# Patient Record
Sex: Male | Born: 1995 | Race: Black or African American | Hispanic: No | Marital: Single | Smoking: Current some day smoker
Health system: Southern US, Community
[De-identification: ages and names within clinical notes are randomized; demographics above are authoritative.]

## PROBLEM LIST (undated history)

## (undated) DIAGNOSIS — G43909 Migraine, unspecified, not intractable, without status migrainosus: Secondary | ICD-10-CM

## (undated) DIAGNOSIS — R519 Headache, unspecified: Secondary | ICD-10-CM

## (undated) DIAGNOSIS — R51 Headache: Secondary | ICD-10-CM

## (undated) DIAGNOSIS — J45909 Unspecified asthma, uncomplicated: Secondary | ICD-10-CM

## (undated) DIAGNOSIS — K589 Irritable bowel syndrome without diarrhea: Secondary | ICD-10-CM

## (undated) HISTORY — DX: Headache, unspecified: R51.9

## (undated) HISTORY — PX: ESOPHAGOGASTRODUODENOSCOPY ENDOSCOPY: SHX5814

## (undated) HISTORY — DX: Headache: R51

## (undated) HISTORY — DX: Migraine, unspecified, not intractable, without status migrainosus: G43.909

---
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
## 2014-10-15 ENCOUNTER — Emergency Department (HOSPITAL_COMMUNITY): Payer: BLUE CROSS/BLUE SHIELD

## 2014-10-15 ENCOUNTER — Encounter (HOSPITAL_COMMUNITY): Payer: Self-pay | Admitting: *Deleted

## 2014-10-15 ENCOUNTER — Emergency Department (HOSPITAL_COMMUNITY)
Admission: EM | Admit: 2014-10-15 | Discharge: 2014-10-15 | Disposition: A | Discharge status: Home / Self Care | Payer: BLUE CROSS/BLUE SHIELD | Attending: Emergency Medicine | Admitting: Emergency Medicine

## 2014-10-15 DIAGNOSIS — R1030 Lower abdominal pain, unspecified: Secondary | ICD-10-CM | POA: Insufficient documentation

## 2014-10-15 DIAGNOSIS — R112 Nausea with vomiting, unspecified: Secondary | ICD-10-CM | POA: Insufficient documentation

## 2014-10-15 DIAGNOSIS — R197 Diarrhea, unspecified: Secondary | ICD-10-CM | POA: Diagnosis not present

## 2014-10-15 DIAGNOSIS — R630 Anorexia: Secondary | ICD-10-CM | POA: Diagnosis not present

## 2014-10-15 DIAGNOSIS — R109 Unspecified abdominal pain: Secondary | ICD-10-CM | POA: Diagnosis present

## 2014-10-15 HISTORY — DX: Unspecified asthma, uncomplicated: J45.909

## 2014-10-15 LAB — COMPREHENSIVE METABOLIC PANEL
ALT: 24 U/L (ref 0–53)
AST: 20 U/L (ref 0–37)
Albumin: 4.8 g/dL (ref 3.5–5.2)
Alkaline Phosphatase: 48 U/L (ref 39–117)
Anion gap: 6 (ref 5–15)
BUN: 10 mg/dL (ref 6–23)
CO2: 30 mmol/L (ref 19–32)
Calcium: 9.7 mg/dL (ref 8.4–10.5)
Chloride: 106 mmol/L (ref 96–112)
Creatinine, Ser: 0.86 mg/dL (ref 0.50–1.35)
GFR calc Af Amer: >90 mL/min (ref >90)
GLUCOSE: 95 mg/dL (ref 70–99)
POTASSIUM: 3.5 mmol/L (ref 3.5–5.1)
Sodium: 142 mmol/L (ref 135–145)
Total Bilirubin: 1.1 mg/dL (ref 0.3–1.2)
Total Protein: 7.4 g/dL (ref 6.0–8.3)

## 2014-10-15 LAB — CBC WITH DIFFERENTIAL/PLATELET
BASOS ABS: 0 10*3/uL (ref 0.0–0.1)
BASOS PCT: 0 % (ref 0–1)
Eosinophils Absolute: 0 10*3/uL (ref 0.0–0.7)
Eosinophils Relative: 0 % (ref 0–5)
HCT: 38.3 % — ABNORMAL LOW (ref 39.0–52.0)
Hemoglobin: 13.6 g/dL (ref 13.0–17.0)
Lymphocytes Relative: 18 % (ref 12–46)
Lymphs Abs: 1.3 10*3/uL (ref 0.7–4.0)
MCH: 30.5 pg (ref 26.0–34.0)
MCHC: 35.5 g/dL (ref 30.0–36.0)
MCV: 85.9 fL (ref 78.0–100.0)
MONOS PCT: 6 % (ref 3–12)
Monocytes Absolute: 0.5 10*3/uL (ref 0.1–1.0)
Neutro Abs: 5.4 10*3/uL (ref 1.7–7.7)
Neutrophils Relative %: 76 % (ref 43–77)
Platelets: 194 10*3/uL (ref 150–400)
RBC: 4.46 MIL/uL (ref 4.22–5.81)
RDW: 12.9 % (ref 11.5–15.5)
WBC: 7.2 10*3/uL (ref 4.0–10.5)

## 2014-10-15 LAB — URINALYSIS, ROUTINE W REFLEX MICROSCOPIC
Bilirubin Urine: NEGATIVE
Glucose, UA: NEGATIVE mg/dL
Hgb urine dipstick: NEGATIVE
Ketones, ur: NEGATIVE mg/dL
Leukocytes, UA: NEGATIVE
Nitrite: NEGATIVE
PH: 6.5 (ref 5.0–8.0)
Protein, ur: NEGATIVE mg/dL
SPECIFIC GRAVITY, URINE: 1.021 (ref 1.005–1.030)
UROBILINOGEN UA: 1 mg/dL (ref 0.0–1.0)

## 2014-10-15 LAB — LIPASE, BLOOD: Lipase: 25 U/L (ref 11–59)

## 2014-10-15 MED ORDER — ONDANSETRON 8 MG PO TBDP: 8.0000 mg | ORAL_TABLET | Freq: Three times a day (TID) | ORAL | Status: DC | PRN

## 2014-10-15 MED ORDER — SODIUM CHLORIDE 0.9 % IV BOLUS (SEPSIS)
500.0000 mL | Freq: Once | INTRAVENOUS | Status: AC
  Administered 2014-10-15: 500 mL via INTRAVENOUS

## 2014-10-15 MED ORDER — OMEPRAZOLE 20 MG PO CPDR: 20.0000 mg | DELAYED_RELEASE_CAPSULE | Freq: Every day | ORAL | Status: DC

## 2014-10-15 MED ORDER — IOHEXOL 300 MG/ML  SOLN
50.0000 mL | Freq: Once | INTRAMUSCULAR | Status: AC | PRN
  Administered 2014-10-15: 50 mL via ORAL

## 2014-10-15 MED ORDER — ONDANSETRON HCL 4 MG/2ML IJ SOLN
4.0000 mg | Freq: Once | INTRAMUSCULAR | Status: AC
  Administered 2014-10-15: 4 mg via INTRAVENOUS
  Filled 2014-10-15: qty 2

## 2014-10-15 MED ORDER — IOHEXOL 300 MG/ML  SOLN
100.0000 mL | Freq: Once | INTRAMUSCULAR | Status: AC | PRN
  Administered 2014-10-15: 100 mL via INTRAVENOUS

## 2014-10-15 NOTE — ED Notes (Signed)
Per [facility], pt seen by Dr. Cristobal GoldmannBorun and MD suggested CT abd/pelvis with contrast as symptoms have continued for a month without improvement.

## 2014-10-15 NOTE — Discharge Instructions (Signed)

## 2014-10-15 NOTE — Progress Notes (Signed)
pcp is  Silas FloodBORUN, ALEXANDER GREGORY    [address] [place], [place] [id number] [phone] [facility]

## 2014-10-15 NOTE — ED Provider Notes (Signed)
CSN: [id number]     Arrival date & time 10/15/14  4098 History   First MD Initiated Contact with Patient 10/15/14 1037     Chief Complaint  Patient presents with  . Emesis  . Nausea  . Abdominal Pain     (Consider location/radiation/quality/duration/timing/severity/associated sxs/prior Treatment) Patient is a 18 y.o. male presenting with vomiting and abdominal pain. The history is provided by the patient.  Emesis Associated symptoms: abdominal pain and diarrhea   Abdominal Pain Associated symptoms: diarrhea, nausea and vomiting   Associated symptoms: no chest pain, no fever and no shortness of breath    patient with nausea vomiting for the last month. Also abdominal pain. States the pain is better for over the last month but he gets episodes of increased pain when he is hungry. He states he has had some vomiting. He states eating does not change the pain. No fevers. He has had a little diarrhea last couple days. Saw his primary care doctor 3 days ago who stated they were going to get a CT scan, reportedly his insurance will not cover it. Told to return to the ER if pain continued. No fevers. No dysuria. No fevers. No weight loss.  Past Medical History  Diagnosis Date  . Asthma    History reviewed. No pertinent past surgical history. No family history on file. History  Substance Use Topics  . Smoking status: Never Smoker   . Smokeless tobacco: Not on file  . Alcohol Use: No    Review of Systems  Constitutional: Positive for appetite change. Negative for fever, activity change and unexpected weight change.  Eyes: Negative for pain.  Respiratory: Negative for chest tightness and shortness of breath.   Cardiovascular: Negative for chest pain and leg swelling.  Gastrointestinal: Positive for nausea, vomiting, abdominal pain and diarrhea.  Genitourinary: Negative for flank pain.  Musculoskeletal: Negative for back pain and neck stiffness.  Skin: Negative for rash.  Neurological:  Negative for weakness and numbness.  Psychiatric/Behavioral: Negative for behavioral problems.      Allergies  Other  Home Medications   Prior to Admission medications   Medication Sig Start Date End Date Taking? Authorizing Provider  omeprazole (PRILOSEC) 20 MG capsule Take 1 capsule (20 mg total) by mouth daily. 10/15/14   Juliet Rude. Rubin Payor, MD  ondansetron (ZOFRAN-ODT) 8 MG disintegrating tablet Take 1 tablet (8 mg total) by mouth every 8 (eight) hours as needed for nausea or vomiting. 10/15/14   Juliet Rude. Mccartney Chuba, MD   BP 152/85 mmHg  Pulse 72  Temp(Src) 98.1 F (36.7 C) (Oral)  Resp 16  SpO2 100% Physical Exam  Constitutional: He is oriented to person, place, and time. He appears well-developed and well-nourished.  HENT:  Head: Normocephalic and atraumatic.  Neck: Normal range of motion. Neck supple.  Cardiovascular: Normal rate, regular rhythm and normal heart sounds.   No murmur heard. Pulmonary/Chest: Effort normal and breath sounds normal.  Abdominal: Soft. He exhibits no mass. There is tenderness. There is no rebound and no guarding.  Mid to lower abdominal tenderness. No rebound or guarding.  Genitourinary:  No CVA tenderness.  Musculoskeletal: Normal range of motion. He exhibits no edema.  Neurological: He is alert and oriented to person, place, and time. No cranial nerve deficit.  Skin: Skin is warm and dry.  Psychiatric: He has a normal mood and affect.  Nursing note and vitals reviewed.   ED Course  Procedures (including critical care time) Labs Review Labs Reviewed  CBC  WITH DIFFERENTIAL/PLATELET - Abnormal; Notable for the following:    HCT 38.3 (*)    All other components within normal limits  URINALYSIS, ROUTINE W REFLEX MICROSCOPIC  COMPREHENSIVE METABOLIC PANEL  LIPASE, BLOOD    Imaging Review Ct Abdomen Pelvis W Contrast  10/15/2014   CLINICAL DATA:  Acute mid abdominal pain, nausea and vomiting  EXAM: CT ABDOMEN AND PELVIS WITH CONTRAST   TECHNIQUE: Multidetector CT imaging of the abdomen and pelvis was performed using the standard protocol following bolus administration of intravenous contrast.  CONTRAST:  100mL OMNIPAQUE IOHEXOL 300 MG/ML  SOLN  COMPARISON:  None.  FINDINGS: Lower chest: Clear lung bases. Normal heart size. No pericardial or pleural effusion.  Abdomen: Liver, gallbladder, biliary system, pancreas, spleen, adrenal glands, and kidneys are within normal limits for age and demonstrate no acute process.  No renal obstruction or hydronephrosis. No obstructing urinary tract calculus.  Negative for bowel obstruction, dilatation, ileus, free air.  No abdominal free fluid, fluid collection, hemorrhage, abscess, or adenopathy.  Normal appendix opacified with oral contrast in the right lower quadrant extending into the pelvis.  Normal aorta.  Negative for aneurysm.  Pelvis: Urinary bladder unremarkable. Distal colon is air and stool filled. No pelvic free fluid, fluid collection, hemorrhage, abscess, adenopathy, inguinal abnormality, or hernia.  No acute osseous finding.  IMPRESSION: No acute intra-abdominal or pelvic finding.  Normal appendix.   Electronically Signed   By: Ruel Favorsrevor  Shick M.D.   On: 10/15/2014 14:12     EKG Interpretation None      MDM   Final diagnoses:  Abdominal pain    Patient with abdominal pain. Also has had nausea after eating. CT and lab work reassuring. Will discharge home with GI follow-up.    Juliet RudeNathan R. Rubin PayorPickering, MD 10/15/14 [phone]

## 2014-10-15 NOTE — ED Notes (Signed)
Pt reports n/v, abd pain x1.5 weeks. Also pain with urination. Emesis usually only in mornings. Saw PCP Tuesday, advised to come here if things were not resolving. PCP would like to speak to provider if necessary, pt sts he was sent for CT scan. Alexander Borham ?sp, [phone]

## 2014-12-08 ENCOUNTER — Ambulatory Visit: Payer: BLUE CROSS/BLUE SHIELD | Admitting: Gastroenterology

## 2015-06-24 ENCOUNTER — Other Ambulatory Visit: Payer: Self-pay | Admitting: Physician Assistant

## 2015-06-24 DIAGNOSIS — R1011 Right upper quadrant pain: Secondary | ICD-10-CM

## 2015-06-29 ENCOUNTER — Ambulatory Visit
Admission: RE | Admit: 2015-06-29 | Discharge: 2015-06-29 | Disposition: A | Discharge status: Home / Self Care | Payer: BLUE CROSS/BLUE SHIELD | Source: Ambulatory Visit | Attending: Physician Assistant | Admitting: Physician Assistant

## 2015-06-29 DIAGNOSIS — R1011 Right upper quadrant pain: Secondary | ICD-10-CM

## 2015-08-10 DIAGNOSIS — E559 Vitamin D deficiency, unspecified: Secondary | ICD-10-CM | POA: Insufficient documentation

## 2015-08-10 DIAGNOSIS — B353 Tinea pedis: Secondary | ICD-10-CM | POA: Insufficient documentation

## 2016-07-17 ENCOUNTER — Emergency Department (HOSPITAL_COMMUNITY): Payer: No Typology Code available for payment source

## 2016-07-17 ENCOUNTER — Encounter (HOSPITAL_COMMUNITY): Payer: Self-pay | Admitting: Emergency Medicine

## 2016-07-17 ENCOUNTER — Emergency Department (HOSPITAL_COMMUNITY)
Admission: EM | Admit: 2016-07-17 | Discharge: 2016-07-17 | Disposition: A | Discharge status: Home / Self Care | Payer: No Typology Code available for payment source | Attending: Emergency Medicine | Admitting: Emergency Medicine

## 2016-07-17 DIAGNOSIS — Y92410 Unspecified street and highway as the place of occurrence of the external cause: Secondary | ICD-10-CM | POA: Insufficient documentation

## 2016-07-17 DIAGNOSIS — J45909 Unspecified asthma, uncomplicated: Secondary | ICD-10-CM | POA: Insufficient documentation

## 2016-07-17 DIAGNOSIS — M542 Cervicalgia: Secondary | ICD-10-CM | POA: Diagnosis not present

## 2016-07-17 DIAGNOSIS — Z79899 Other long term (current) drug therapy: Secondary | ICD-10-CM | POA: Diagnosis not present

## 2016-07-17 DIAGNOSIS — Y9389 Activity, other specified: Secondary | ICD-10-CM | POA: Insufficient documentation

## 2016-07-17 DIAGNOSIS — Y999 Unspecified external cause status: Secondary | ICD-10-CM | POA: Diagnosis not present

## 2016-07-17 MED ORDER — CYCLOBENZAPRINE HCL 10 MG PO TABS
10.0000 mg | ORAL_TABLET | Freq: Once | ORAL | Status: AC
  Administered 2016-07-17: 10 mg via ORAL
  Filled 2016-07-17: qty 1

## 2016-07-17 MED ORDER — CYCLOBENZAPRINE HCL 5 MG PO TABS: 5.0000 mg | ORAL_TABLET | Freq: Three times a day (TID) | ORAL | 0 refills | Status: DC | PRN

## 2016-07-17 MED ORDER — NAPROXEN 500 MG PO TABS: 500.0000 mg | ORAL_TABLET | Freq: Two times a day (BID) | ORAL | 0 refills | Status: DC

## 2016-07-17 MED ORDER — IBUPROFEN 200 MG PO TABS
600.0000 mg | ORAL_TABLET | Freq: Once | ORAL | Status: AC
  Administered 2016-07-17: 600 mg via ORAL
  Filled 2016-07-17: qty 3
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
## 2016-07-17 NOTE — Discharge Instructions (Signed)
Read the information below.  °Your x-rays were re-assuring.  °You may feel sore for the next 2-3 days. I have prescribed naprosyn and flexeril for relief. While taking naprosyn do not take other NSAIDs (ibuprofen, motrin, or aleve). Flexeril can make you drowsy, do not drive after taking.  °You can apply heat/ice to affected areas for 20 minute increments.  °Warm showers can soothe sore muscles.  °If symptoms persist for more than a week follow up with your primary provider.  °Use the prescribed medication as directed.  Please discuss all new medications with your pharmacist.   °You may return to the Emergency Department at any time for worsening condition or any new symptoms that concern you. ° ° ° °
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
## 2017-01-02 ENCOUNTER — Encounter (HOSPITAL_COMMUNITY): Payer: Self-pay | Admitting: Emergency Medicine

## 2017-01-02 ENCOUNTER — Emergency Department (HOSPITAL_COMMUNITY)
Admission: EM | Admit: 2017-01-02 | Discharge: 2017-01-02 | Disposition: A | Discharge status: Home / Self Care | Payer: Managed Care, Other (non HMO) | Attending: Physician Assistant | Admitting: Physician Assistant

## 2017-01-02 ENCOUNTER — Emergency Department (HOSPITAL_COMMUNITY): Payer: Managed Care, Other (non HMO)

## 2017-01-02 DIAGNOSIS — Z79899 Other long term (current) drug therapy: Secondary | ICD-10-CM | POA: Insufficient documentation

## 2017-01-02 DIAGNOSIS — J45909 Unspecified asthma, uncomplicated: Secondary | ICD-10-CM | POA: Diagnosis not present

## 2017-01-02 DIAGNOSIS — R0789 Other chest pain: Secondary | ICD-10-CM

## 2017-01-02 LAB — I-STAT TROPONIN, ED: Troponin i, poc: 0 ng/mL (ref 0.00–0.08)

## 2017-01-02 LAB — CBC
HEMATOCRIT: 39.9 % (ref 39.0–52.0)
HEMOGLOBIN: 14.5 g/dL (ref 13.0–17.0)
MCH: 30.9 pg (ref 26.0–34.0)
MCHC: 36.3 g/dL — AB (ref 30.0–36.0)
MCV: 85.1 fL (ref 78.0–100.0)
PLATELETS: 181 10*3/uL (ref 150–400)
RBC: 4.69 MIL/uL (ref 4.22–5.81)
RDW: 13.2 % (ref 11.5–15.5)
WBC: 7.5 10*3/uL (ref 4.0–10.5)

## 2017-01-02 LAB — BASIC METABOLIC PANEL
ANION GAP: 9 (ref 5–15)
BUN: 11 mg/dL (ref 6–20)
CHLORIDE: 101 mmol/L (ref 101–111)
CO2: 29 mmol/L (ref 22–32)
Calcium: 9.9 mg/dL (ref 8.9–10.3)
Creatinine, Ser: 0.89 mg/dL (ref 0.61–1.24)
GFR calc Af Amer: >60 mL/min (ref >60)
GLUCOSE: 128 mg/dL — AB (ref 65–99)
POTASSIUM: 3.5 mmol/L (ref 3.5–5.1)
Sodium: 139 mmol/L (ref 135–145)

## 2017-01-02 LAB — D-DIMER, QUANTITATIVE: D-Dimer, Quant: <0.27 ug/mL-FEU (ref 0.00–0.50)

## 2017-01-02 MED ORDER — IBUPROFEN 800 MG PO TABS: 800.0000 mg | ORAL_TABLET | Freq: Three times a day (TID) | ORAL | 0 refills | Status: DC | PRN

## 2017-01-02 MED ORDER — GI COCKTAIL ~~LOC~~
30.0000 mL | Freq: Once | ORAL | Status: AC
  Administered 2017-01-02: 30 mL via ORAL
  Filled 2017-01-02: qty 30

## 2017-01-02 NOTE — Discharge Instructions (Signed)
Return here as needed. Follow up with a primary doctor. °

## 2017-01-02 NOTE — ED Notes (Signed)
Pt ambulated with pulse ox. Reading stayed between 99-100%. Pt denies dyspnea, but reports that he is still experiencing chest pressure.

## 2017-01-02 NOTE — ED Triage Notes (Signed)
Patient states that left side chest pain started this morning upon waking up with SOB.  Patient was seen at [facility] and told to come to [facility] ED for further evaluation.  Patient had vomiting and diarrhea that has been going on for while and seeing GI MD for that.  Coughing makes pain worse.

## 2017-01-02 NOTE — ED Provider Notes (Signed)
[facility]-EMERGENCY DEPT Provider Note   CSN: [id number] Arrival date & time: 01/02/17  1127     History   Chief Complaint Chief Complaint  Patient presents with  . Chest Pain    HPI Mark Wade is a 21 y.o. male.  HPI   21 year old male presenting for evaluation of chest pain. Patient report for the past 4 months he has had intermittent chest discomfort. He described his pain as a sharp occasional burning sensation to the left side of his chest usually lasting for less than 10 minutes however does associate with short of breath and some nausea. Symptom has been increasing frequency within the past several days. Endorse occasional nonproductive cough. Several days ago he also notice trace of blood in the stool. He went to urgent care earlier today for this complaint but was sent to the ED for further evaluation for potential PE. Patient denies any prior history of PE or DVT, no recent surgery, prolonged bed rest, unilateral leg swelling of calf pain. 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 He admits to eating greasy and spicy food on a regular basis and sometimes eat late at night. Denies and prior history of acid reflux. Also denies any significant family history of cardiac disease or premature cardiac death. The patient did lift some heavy refrigerator 2 days ago but denies any chest pain while doing it. Patient denies alcohol abuse, use marijuana on occasion but not a smoker. Currently complaining of moderate chest pain and shortness of breath. The pain did wake him up early this morning.  Past Medical History:  Diagnosis Date  . Asthma     There are no active problems to display for this patient.   History reviewed. No pertinent surgical history.     Home Medications    Prior to Admission medications   Medication Sig Start Date End Date Taking? Authorizing Provider  cyclobenzaprine (FLEXERIL) 5 MG tablet Take 1 tablet (5 mg total) by mouth 3  (three) times daily as needed for muscle spasms. 07/17/16   Deborha Payment, PA-C  naproxen (NAPROSYN) 500 MG tablet Take 1 tablet (500 mg total) by mouth 2 (two) times daily. 07/17/16   Deborha Payment, PA-C  omeprazole (PRILOSEC) 20 MG capsule Take 1 capsule (20 mg total) by mouth daily. 10/15/14   Benjiman Core, MD  ondansetron (ZOFRAN-ODT) 8 MG disintegrating tablet Take 1 tablet (8 mg total) by mouth every 8 (eight) hours as needed for nausea or vomiting. 10/15/14   Benjiman Core, MD    Family History No family history on file.  Social History Social History  Substance Use Topics  . Smoking status: Never Smoker  . Smokeless tobacco: Never Used  . Alcohol use No     Allergies   Other   Review of Systems Review of Systems  All other systems reviewed and are negative.    Physical Exam Updated Vital Signs BP 131/61 (BP Location: Left Arm)   Pulse 72   Temp 98.7 F (37.1 C) (Oral)   Resp 15   Ht 6' 1.5" (1.867 m)   Wt 92.5 kg   SpO2 99%   BMI 26.55 kg/m   Physical Exam  Constitutional: He is oriented to person, place, and time. He appears well-developed and well-nourished. No distress.  HENT:  Head: Atraumatic.  Eyes: Conjunctivae are normal.  Neck: Neck supple.  Cardiovascular: Normal rate, regular rhythm and intact distal pulses.   Pulmonary/Chest: Effort normal and breath  sounds normal. He exhibits tenderness (Mild tenderness to anterior chest on palpation without any crepitus or emphysema. No overlying skin changes noted.).  Abdominal: Soft. Bowel sounds are normal. He exhibits no distension. There is tenderness (Mild epigastric tenderness without guarding or rebound tenderness.).  Musculoskeletal: He exhibits no edema.  Neurological: He is alert and oriented to person, place, and time.  Skin: No rash noted.  Psychiatric: He has a normal mood and affect.  Nursing note and vitals reviewed.    ED Treatments / Results  Labs (all labs ordered are listed,  but only abnormal results are displayed) Labs Reviewed  BASIC METABOLIC PANEL - Abnormal; Notable for the following:       Result Value   Glucose, Bld 128 (*)    All other components within normal limits  CBC - Abnormal; Notable for the following:    MCHC 36.3 (*)    All other components within normal limits  I-STAT TROPOININ, ED    EKG  EKG Interpretation None     ED ECG REPORT   Date: 01/02/2017  Rate: 72  Rhythm: normal sinus rhythm  QRS Axis: normal  Intervals: normal  ST/T Wave abnormalities: normal  Conduction Disutrbances:nonspecific intraventricular conduction delay  Narrative Interpretation:   Old EKG Reviewed: none available  I have personally reviewed the EKG tracing and agree with the computerized printout as noted.   Radiology Dg Chest 2 View  Result Date: 01/02/2017 CLINICAL DATA:  21 year old male with mid and left chest pain acute onset this morning with shortness of breath. Initial encounter. EXAM: CHEST  2 VIEW COMPARISON:  CT Abdomen and Pelvis 10/15/2014 FINDINGS: Normal lung volumes. Normal cardiac size and mediastinal contours. Visualized tracheal air column is within normal limits. The lungs are clear. No pneumothorax or pleural effusion. *INSERT* negative bones negative visible bowel gas pattern. IMPRESSION: Negative.  No acute cardiopulmonary abnormality. Electronically Signed   By: Odessa Fleming M.D.   On: 01/02/2017 11:56    Procedures Procedures (including critical care time)  Medications Ordered in ED Medications - No data to display   Initial Impression / Assessment and Plan / ED Course  I have reviewed the triage vital signs and the nursing notes.  Pertinent labs & imaging results that were available during my care of the patient were reviewed by me and considered in my medical decision making (see chart for details).     BP 131/61 (BP Location: Left Arm)   Pulse 72   Temp 98.7 F (37.1 C) (Oral)   Resp 15   Ht 6' 1.5" (1.867 m)   Wt  92.5 kg   SpO2 99%   BMI 26.55 kg/m    Final Clinical Impressions(s) / ED Diagnoses   Final diagnoses:  Atypical chest pain    New Prescriptions New Prescriptions   No medications on file   1:54 PM Patient here with complaints of chest pain and shortness of breath. His pain is unlikely to be cardiopulmonary etiology. Suspect reflux causing pain. He also have done some heavy lifting and does have reproducible chest wall pain likely muscular skeletal in origin. However, patient was sent here from urgent care concerning for potential PE. My suspicion for PE is low, low PE score on Wells and is PERC negative, doubt PE. HEART score of 1, low risk of MACE. Workup initiated. GI cocktail given.  3:38 PM Mild improvement with GI cocktail.  Pt otherwise resting comfortably.  Pt is aware we are waiting for his labs.  Care  signout to oncoming provider who will f/u on D-dimer and troponin result and will determine disposition.      Fayrene Helper, PA-C 01/02/17 1540    Courteney Lyn Mackuen, MD 01/03/17 1444

## 2017-03-06 DIAGNOSIS — F17210 Nicotine dependence, cigarettes, uncomplicated: Secondary | ICD-10-CM | POA: Insufficient documentation

## 2017-07-18 ENCOUNTER — Emergency Department (HOSPITAL_COMMUNITY)
Admission: EM | Admit: 2017-07-18 | Discharge: 2017-07-18 | Disposition: A | Discharge status: Home / Self Care | Payer: Managed Care, Other (non HMO) | Attending: Emergency Medicine | Admitting: Emergency Medicine

## 2017-07-18 ENCOUNTER — Emergency Department (HOSPITAL_COMMUNITY): Payer: Managed Care, Other (non HMO)

## 2017-07-18 ENCOUNTER — Encounter (HOSPITAL_COMMUNITY): Payer: Self-pay

## 2017-07-18 DIAGNOSIS — Y998 Other external cause status: Secondary | ICD-10-CM | POA: Insufficient documentation

## 2017-07-18 DIAGNOSIS — J45909 Unspecified asthma, uncomplicated: Secondary | ICD-10-CM | POA: Insufficient documentation

## 2017-07-18 DIAGNOSIS — Y939 Activity, unspecified: Secondary | ICD-10-CM | POA: Insufficient documentation

## 2017-07-18 DIAGNOSIS — S161XXA Strain of muscle, fascia and tendon at neck level, initial encounter: Secondary | ICD-10-CM | POA: Insufficient documentation

## 2017-07-18 DIAGNOSIS — T148XXA Other injury of unspecified body region, initial encounter: Secondary | ICD-10-CM

## 2017-07-18 DIAGNOSIS — Y929 Unspecified place or not applicable: Secondary | ICD-10-CM | POA: Insufficient documentation

## 2017-07-18 DIAGNOSIS — S29011A Strain of muscle and tendon of front wall of thorax, initial encounter: Secondary | ICD-10-CM | POA: Insufficient documentation

## 2017-07-18 DIAGNOSIS — M545 Low back pain: Secondary | ICD-10-CM | POA: Insufficient documentation

## 2017-07-18 HISTORY — DX: Irritable bowel syndrome, unspecified: K58.9

## 2017-07-18 MED ORDER — TETRACAINE HCL 0.5 % OP SOLN
1.0000 [drp] | Freq: Once | OPHTHALMIC | Status: AC
  Administered 2017-07-18: 1 [drp] via OPHTHALMIC
  Filled 2017-07-18: qty 4

## 2017-07-18 MED ORDER — CYCLOBENZAPRINE HCL 10 MG PO TABS: 10.0000 mg | ORAL_TABLET | Freq: Two times a day (BID) | ORAL | 0 refills | Status: DC | PRN

## 2017-07-18 MED ORDER — FLUORESCEIN SODIUM 1 MG OP STRP
1.0000 | ORAL_STRIP | Freq: Once | OPHTHALMIC | Status: AC
  Administered 2017-07-18: 1 via OPHTHALMIC
  Filled 2017-07-18: qty 1

## 2017-07-18 MED ORDER — KETOROLAC TROMETHAMINE 60 MG/2ML IM SOLN
60.0000 mg | Freq: Once | INTRAMUSCULAR | Status: AC
  Administered 2017-07-18: 60 mg via INTRAMUSCULAR
  Filled 2017-07-18: qty 2
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
## 2017-07-18 NOTE — Discharge Instructions (Signed)
As we discussed, you will be very sore for the next few days.    You can take Tylenol or Ibuprofen as directed for pain. You can alternate Tylenol and Ibuprofen every 4 hours. If you take Tylenol at 1pm, then you can take Ibuprofen at 5pm. Then you can take Tylenol again at 9pm.   Take Flexeril as prescribed. This medication will make you drowsy so do not drive or drink alcohol when taking it.  You can apply ice to the area for the first 48 hours. After that you can apply heat.  Follow-up with your primary care doctor in 24-48 hours for further evaluation.   Return to the Emergency Department for any worsening pain, chest pain, difficulty breathing, vomiting, numbness/weakness of your arms or legs, difficulty walking, vision changes, eye pain or redness, urinary or bowel accidents, or any other worsening or concerning symptoms.

## 2018-05-09 ENCOUNTER — Ambulatory Visit: Payer: Managed Care, Other (non HMO) | Admitting: Family Medicine

## 2018-05-24 ENCOUNTER — Encounter (HOSPITAL_COMMUNITY): Payer: Self-pay | Admitting: Emergency Medicine

## 2018-05-24 ENCOUNTER — Emergency Department (HOSPITAL_COMMUNITY)
Admission: EM | Admit: 2018-05-24 | Discharge: 2018-05-24 | Disposition: A | Discharge status: Home / Self Care | Payer: BLUE CROSS/BLUE SHIELD | Attending: Emergency Medicine | Admitting: Emergency Medicine

## 2018-05-24 DIAGNOSIS — J45909 Unspecified asthma, uncomplicated: Secondary | ICD-10-CM | POA: Insufficient documentation

## 2018-05-24 DIAGNOSIS — G43809 Other migraine, not intractable, without status migrainosus: Secondary | ICD-10-CM | POA: Insufficient documentation

## 2018-05-24 MED ORDER — DIPHENHYDRAMINE HCL 50 MG/ML IJ SOLN
25.0000 mg | Freq: Once | INTRAMUSCULAR | Status: AC
  Administered 2018-05-24: 25 mg via INTRAVENOUS
  Filled 2018-05-24: qty 1

## 2018-05-24 MED ORDER — KETOROLAC TROMETHAMINE 15 MG/ML IJ SOLN
15.0000 mg | Freq: Once | INTRAMUSCULAR | Status: AC
  Administered 2018-05-24: 15 mg via INTRAVENOUS
  Filled 2018-05-24: qty 1

## 2018-05-24 MED ORDER — SODIUM CHLORIDE 0.9 % IV BOLUS
1000.0000 mL | Freq: Once | INTRAVENOUS | Status: AC
  Administered 2018-05-24: 1000 mL via INTRAVENOUS

## 2018-05-24 MED ORDER — PROCHLORPERAZINE MALEATE 10 MG PO TABS: 10.0000 mg | ORAL_TABLET | Freq: Two times a day (BID) | ORAL | 0 refills | Status: DC | PRN

## 2018-05-24 MED ORDER — PROCHLORPERAZINE EDISYLATE 10 MG/2ML IJ SOLN
10.0000 mg | Freq: Once | INTRAMUSCULAR | Status: AC
  Administered 2018-05-24: 10 mg via INTRAVENOUS
  Filled 2018-05-24: qty 2

## 2018-05-24 NOTE — ED Triage Notes (Signed)
Patient here from home with complaints of migraine x3 days nausea and vomiting that started this morning.

## 2018-05-24 NOTE — ED Provider Notes (Signed)
[facility] Emergency Department Provider Note MRN:  [id number]  Arrival date & time: 05/24/18     Chief Complaint   Emesis; Nausea; and Migraine   History of Present Illness   Mark Wade is a 22 y.o. year-old male with a history of migraines presenting to the ED with chief complaint of headache.  Gradual onset headache that began 3 days ago.  Pain is located in the bilateral forehead region.  Mild to moderate in severity, worse with bright lights.  Associated with nausea and few episodes of nonbloody nonbilious emesis.  Endorsing bilateral leg paresthesias.  Denies numbness or weakness.  No neck pain, no fever.  Symptoms similar to prior migraines.  Review of Systems  A complete 10 system review of systems was obtained and all systems are negative except as noted in the HPI and PMH.   Patient's Health History    Past Medical History:  Diagnosis Date  . Asthma   . Irritable bowel syndrome (IBS)     Past Surgical History:  Procedure Laterality Date  . ESOPHAGOGASTRODUODENOSCOPY ENDOSCOPY      No family history on file.  Social History   Socioeconomic History  . Marital status: Single    Spouse name: Not on file  . Number of children: Not on file  . Years of education: Not on file  . Highest education level: Not on file  Occupational History  . Not on file  Social Needs  . Financial resource strain: Not on file  . Food insecurity:    Worry: Not on file    Inability: Not on file  . Transportation needs:    Medical: Not on file    Non-medical: Not on file  Tobacco Use  . Smoking status: Never Smoker  . Smokeless tobacco: Never Used  Substance and Sexual Activity  . Alcohol use: No  . Drug use: Yes    Types: Marijuana  . Sexual activity: Not on file  Lifestyle  . Physical activity:    Days per week: Not on file    Minutes per session: Not on file  . Stress: Not on file  Relationships  . Social connections:    Talks on phone: Not on file    Gets together: Not on file    Attends religious service: Not on file    Active member of club or organization: Not on file    Attends meetings of clubs or organizations: Not on file    Relationship status: Not on file  . Intimate partner violence:    Fear of current or ex partner: Not on file    Emotionally abused: Not on file    Physically abused: Not on file    Forced sexual activity: Not on file  Other Topics Concern  . Not on file  Social History Narrative  . Not on file     Physical Exam  Vital Signs and Nursing Notes reviewed Vitals:   05/24/18 1023  BP: (!) 159/90  Pulse: 74  Resp: 16  Temp: 99.5 F (37.5 C)  SpO2: 100%    CONSTITUTIONAL: Well-appearing, NAD NEURO:  Alert and oriented x 3, no focal deficits, no meningismus EYES:  eyes equal and reactive ENT/NECK:  no LAD, no JVD CARDIO: Regular rate, well-perfused, normal S1 and S2 PULM:  CTAB no wheezing or rhonchi GI/GU:  normal bowel sounds, non-distended, non-tender MSK/SPINE:  No gross deformities, no edema SKIN:  no rash, atraumatic PSYCH:  Appropriate speech and behavior  Diagnostic  and Interventional Summary    EKG Interpretation  Date/Time:    Ventricular Rate:    PR Interval:    QRS Duration:   QT Interval:    QTC Calculation:   R Axis:     Text Interpretation:        Labs Reviewed - No data to display  No orders to display    Medications  prochlorperazine (COMPAZINE) injection 10 mg (10 mg Intravenous Given 05/24/18 1122)  ketorolac (TORADOL) 15 MG/ML injection 15 mg (15 mg Intravenous Given 05/24/18 1122)  diphenhydrAMINE (BENADRYL) injection 25 mg (25 mg Intravenous Given 05/24/18 1122)  sodium chloride 0.9 % bolus 1,000 mL (1,000 mLs Intravenous New Bag/Given 05/24/18 1121)     Procedures Critical Care  ED Course and Medical Decision Making  I have reviewed the triage vital signs and the nursing notes.  Pertinent labs & imaging results that were available during my care of the patient  were reviewed by me and considered in my medical decision making (see below for details).    Favoring uncomplicated migraine was 22 year old with history of migraines.  Will provide migraine cocktail and reassess.  No fever or meningismus, no sudden onset or neurological findings to suggest subarachnoid.  Feeling much better after migraine cocktail, feels hungry, requesting discharge.  Advised follow-up with PCP to discuss preventative medications.  After the discussed management above, the patient was determined to be safe for discharge.  The patient was in agreement with this plan and all questions regarding their care were answered.  ED return precautions were discussed and the patient will return to the ED with any significant worsening of condition.  Elmer Sow. Pilar Plate, MD [facility] Emergency Medicine [facility] [email or web address]  Final Clinical Impressions(s) / ED Diagnoses     ICD-10-CM   1. Other migraine without status migrainosus, not intractable G43.809     ED Discharge Orders         Ordered    prochlorperazine (COMPAZINE) 10 MG tablet  2 times daily PRN     05/24/18 1310             Sabas Sous, MD 05/24/18 1313

## 2018-05-24 NOTE — Discharge Instructions (Addendum)
You were evaluated in the Emergency Department and after careful evaluation, we did not find any emergent condition requiring admission or further testing in the hospital. ° °Your symptoms today seem to be due to a migraine.  Please follow-up with a primary care provider to discuss preventative medications. ° °Please return to the Emergency Department if you experience any worsening of your condition.  We encourage you to follow up with a primary care provider.  Thank you for allowing us to be a part of your care. ° °

## 2018-06-13 ENCOUNTER — Encounter: Payer: Self-pay | Admitting: Family Medicine

## 2018-06-13 ENCOUNTER — Ambulatory Visit (INDEPENDENT_AMBULATORY_CARE_PROVIDER_SITE_OTHER): Payer: No Typology Code available for payment source | Admitting: Family Medicine

## 2018-06-13 VITALS — BP 108/78 | HR 74 | Temp 98.6°F | Ht 74.0 in | Wt 210.0 lb

## 2018-06-13 DIAGNOSIS — Z0000 Encounter for general adult medical examination without abnormal findings: Secondary | ICD-10-CM

## 2018-06-13 DIAGNOSIS — B353 Tinea pedis: Secondary | ICD-10-CM | POA: Diagnosis not present

## 2018-06-13 DIAGNOSIS — K589 Irritable bowel syndrome without diarrhea: Secondary | ICD-10-CM | POA: Diagnosis not present

## 2018-06-13 MED ORDER — IBUPROFEN 800 MG PO TABS: 800.0000 mg | ORAL_TABLET | Freq: Three times a day (TID) | ORAL | 2 refills | Status: DC | PRN

## 2018-06-13 MED ORDER — ONDANSETRON 8 MG PO TBDP: 8.0000 mg | ORAL_TABLET | Freq: Three times a day (TID) | ORAL | 3 refills | Status: DC | PRN

## 2018-06-13 MED ORDER — TERBINAFINE HCL 1 % EX CREA: 1.0000 "application " | TOPICAL_CREAM | Freq: Two times a day (BID) | CUTANEOUS | 0 refills | Status: DC

## 2018-06-13 NOTE — Progress Notes (Signed)
Subjective:     Mark Wade is a 22 y.o. male and is here for a comprehensive physical exam. The patient reports problems - IBS, h/o tinea pedis.  Also notes being told his bp was elevated in the past.  Had a h/o migraines, not currently an issue.  IBS: -followed by GI -dx'd 1 yr ago -has upcoming EGD -endorses n/v, diarrhea, occasional constipation -negative testing for crohn's  Tinea pedis: -has been using ketoconazole -has been recurrent since played baseball -changes socks daily, washes feet BID  Allergies: NKDA  Social hx: Pt is single.  Pt has two young children.  Went to school at [employer], then transferred to [employer].  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  Pt endorses social EtOH use and marijuana use.  Social History   Socioeconomic History  . Marital status: Single    Spouse name: Not on file  . Number of children: Not on file  . Years of education: Not on file  . Highest education level: Not on file  Occupational History  . Not on file  Social Needs  . Financial resource strain: Not on file  . Food insecurity:    Worry: Not on file    Inability: Not on file  . Transportation needs:    Medical: Not on file    Non-medical: Not on file  Tobacco Use  . Smoking status: Never Smoker  . Smokeless tobacco: Never Used  Substance and Sexual Activity  . Alcohol use: No  . Drug use: Yes    Types: Marijuana  . Sexual activity: Yes  Lifestyle  . Physical activity:    Days per week: Not on file    Minutes per session: Not on file  . Stress: Not on file  Relationships  . Social connections:    Talks on phone: Not on file    Gets together: Not on file    Attends religious service: Not on file    Active member of club or organization: Not on file    Attends meetings of clubs or organizations: Not on file    Relationship status: Not on file  . Intimate partner violence:    Fear of current or ex partner: Not on file    Emotionally abused:  Not on file    Physically abused: Not on file    Forced sexual activity: Not on file  Other Topics Concern  . Not on file  Social History Narrative  . Not on file   Health Maintenance  Topic Date Due  . HIV Screening  08/22/2009  . TETANUS/TDAP  08/22/2013  . INFLUENZA VACCINE  04/17/2018    The following portions of the patient's history were reviewed and updated as appropriate: allergies, current medications, past family history, past medical history, past social history, past surgical history and problem list.  Review of Systems A comprehensive review of systems was negative.   Objective:    BP 108/78 (BP Location: Right Arm, Patient Position: Sitting, Cuff Size: Large)   Pulse 74   Temp 98.6 F (37 C) (Oral)   Ht 6\' 2"  (1.88 m)   Wt 210 lb (95.3 kg)   SpO2 98%   BMI 26.96 kg/m  General appearance: alert, cooperative and no distress Head: Normocephalic, without obvious abnormality, atraumatic Eyes: conjunctivae/corneas clear. PERRL, EOM's intact. Fundi benign. Ears: normal TM's and external ear canals both ears Nose: Nares normal. Septum midline. Mucosa normal. No drainage or sinus tenderness. Throat: lips, mucosa, and  tongue normal; teeth and gums normal Neck: no adenopathy, no carotid bruit, no JVD, supple, symmetrical, trachea midline and thyroid not enlarged, symmetric, no tenderness/mass/nodules Lungs: clear to auscultation bilaterally Heart: regular rate and rhythm, S1, S2 normal, no murmur, click, rub or gallop Abdomen: soft, non-tender; bowel sounds normal; no masses,  no organomegaly Extremities: extremities normal, atraumatic, no cyanosis or edema Skin: Skin color, texture, turgor normal. No rashes or lesions Neurologic: Alert and oriented X 3, normal strength and tone. Normal symmetric reflexes. Normal coordination and gait    Assessment:    Healthy male exam with h/o IBS and tinea pedis.     Plan:     Anticipatory guidance given including wearing  seatbelts, smoke detectors in the home, increasing physical activity, increasing p.o. intake of water and vegetables. -pt declines labs at this time. See After Visit Summary for Counseling Recommendations    IBS -continue f/u with GI -discussed low FODMAP diet -given handouts -consider probiotic use  Tinea pedis -terbinafine 1%  F/u prn  Abbe Amsterdam, MD

## 2018-06-13 NOTE — Patient Instructions (Signed)
Preventive Care 18-39 Years, Male Preventive care refers to lifestyle choices and visits with your health care provider that can promote health and wellness. What does preventive care include?  A yearly physical exam. This is also called an annual well check.  Dental exams once or twice a year.  Routine eye exams. Ask your health care provider how often you should have your eyes checked.  Personal lifestyle choices, including: ? Daily care of your teeth and gums. ? Regular physical activity. ? Eating a healthy diet. ? Avoiding tobacco and drug use. ? Limiting alcohol use. ? Practicing safe sex. What happens during an annual well check? The services and screenings done by your health care provider during your annual well check will depend on your age, overall health, lifestyle risk factors, and family history of disease. Counseling Your health care provider may ask you questions about your:  Alcohol use.  Tobacco use.  Drug use.  Emotional well-being.  Home and relationship well-being.  Sexual activity.  Eating habits.  Work and work Statistician.  Screening You may have the following tests or measurements:  Height, weight, and BMI.  Blood pressure.  Lipid and cholesterol levels. These may be checked every 5 years starting at age 53.  Diabetes screening. This is done by checking your blood sugar (glucose) after you have not eaten for a while (fasting).  Skin check.  Hepatitis C blood test.  Hepatitis B blood test.  Sexually transmitted disease (STD) testing.  Discuss your test results, treatment options, and if necessary, the need for more tests with your health care provider. Vaccines Your health care provider may recommend certain vaccines, such as:  Influenza vaccine. This is recommended every year.  Tetanus, diphtheria, and acellular pertussis (Tdap, Td) vaccine. You may need a Td booster every 10 years.  Varicella vaccine. You may need this if you  have not been vaccinated.  HPV vaccine. If you are 46 or younger, you may need three doses over 6 months.  Measles, mumps, and rubella (MMR) vaccine. You may need at least one dose of MMR.You may also need a second dose.  Pneumococcal 13-valent conjugate (PCV13) vaccine. You may need this if you have certain conditions and have not been vaccinated.  Pneumococcal polysaccharide (PPSV23) vaccine. You may need one or two doses if you smoke cigarettes or if you have certain conditions.  Meningococcal vaccine. One dose is recommended if you are age 20-21 years and a first-year college student living in a residence hall, or if you have one of several medical conditions. You may also need additional booster doses.  Hepatitis A vaccine. You may need this if you have certain conditions or if you travel or work in places where you may be exposed to hepatitis A.  Hepatitis B vaccine. You may need this if you have certain conditions or if you travel or work in places where you may be exposed to hepatitis B.  Haemophilus influenzae type b (Hib) vaccine. You may need this if you have certain risk factors.  Talk to your health care provider about which screenings and vaccines you need and how often you need them. This information is not intended to replace advice given to you by your health care provider. Make sure you discuss any questions you have with your health care provider. Document Released: 10/30/2001 Document Revised: 05/23/2016 Document Reviewed: 07/05/2015 Elsevier Interactive Patient Education  2018 [employer]. Diet for Irritable Bowel Syndrome When you have irritable bowel syndrome (IBS), the foods you  eat and your eating habits are very important. IBS may cause various symptoms, such as abdominal pain, constipation, or diarrhea. Choosing the right foods can help ease discomfort caused by these symptoms. Work with your health care provider and dietitian to find the best eating plan to help  control your symptoms. What general guidelines do I need to follow?  Keep a food diary. This will help you identify foods that cause symptoms. Write down: ? What you eat and when. ? What symptoms you have. ? When symptoms occur in relation to your meals.  Avoid foods that cause symptoms. Talk with your dietitian about other ways to get the same nutrients that are in these foods.  Eat more foods that contain fiber. Take a fiber supplement if directed by your dietitian.  Eat your meals slowly, in a relaxed setting.  Aim to eat 5-6 small meals per day. Do not skip meals.  Drink enough fluids to keep your urine clear or pale yellow.  Ask your health care provider if you should take an over-the-counter probiotic during flare-ups to help restore healthy gut bacteria.  If you have cramping or diarrhea, try making your meals low in fat and high in carbohydrates. Examples of carbohydrates are pasta, rice, whole grain breads and cereals, fruits, and vegetables.  If dairy products cause your symptoms to flare up, try eating less of them. You might be able to handle yogurt better than other dairy products because it contains bacteria that help with digestion. What foods are not recommended? The following are some foods and drinks that may worsen your symptoms:  Fatty foods, such as [place] fries.  Milk products, such as cheese or ice cream.  Chocolate.  Alcohol.  Products with caffeine, such as coffee.  Carbonated drinks, such as soda.  The items listed above may not be a complete list of foods and beverages to avoid. Contact your dietitian for more information. What foods are good sources of fiber? Your health care provider or dietitian may recommend that you eat more foods that contain fiber. Fiber can help reduce constipation and other IBS symptoms. Add foods with fiber to your diet a little at a time so that your body can get used to them. Too much fiber at once might cause gas and  swelling of your abdomen. The following are some foods that are good sources of fiber:  Apples.  Peaches.  Pears.  Berries.  Figs.  Broccoli (raw).  Cabbage.  Carrots.  Raw peas.  Kidney beans.  Lima beans.  Whole grain bread.  Whole grain cereal.  Where to find more information: [employer] for Functional Gastrointestinal Disorders: www.iffgd.[employer] of Diabetes and Digestive and Kidney Diseases: NetworkAff[employer].co.za.aspx This information is not intended to replace advice given to you by your health care provider. Make sure you discuss any questions you have with your health care provider. Document Released: 11/24/2003 Document Revised: 02/09/2016 Document Reviewed: 12/04/2013 Elsevier Interactive Patient Education  2018 [employer]. Irritable Bowel Syndrome, Adult Irritable bowel syndrome (IBS) is not one specific disease. It is a group of symptoms that affects the organs responsible for digestion (gastrointestinal or GI tract). To regulate how your GI tract works, your body sends signals back and forth between your intestines and your brain. If you have IBS, there may be a problem with these signals. As a result, your GI tract does not function normally. Your intestines may become more sensitive and overreact to certain things. This is especially true when you  eat certain foods or when you are under stress. There are four types of IBS. These may be determined based on the consistency of your stool:  IBS with diarrhea.  IBS with constipation.  Mixed IBS.  Unsubtyped IBS.  It is important to know which type of IBS you have. Some treatments are more likely to be helpful for certain types of IBS. What are the causes? The exact cause of IBS is not known. What increases the risk? You may have a higher risk of IBS if:  You are a woman.  You are younger than 24 years  old.  You have a family history of IBS.  You have mental health problems.  You have had bacterial infection of your GI tract.  What are the signs or symptoms? Symptoms of IBS vary from person to person. The main symptom is abdominal pain or discomfort. Additional symptoms usually include one or more of the following:  Diarrhea, constipation, or both.  Abdominal swelling or bloating.  Feeling full or sick after eating a small or regular-size meal.  Frequent gas.  Mucus in the stool.  A feeling of having more stool left after a bowel movement.  Symptoms tend to come and go. They may be associated with stress, psychiatric conditions, or nothing at all. How is this diagnosed? There is no specific test to diagnose IBS. Your health care provider will make a diagnosis based on a physical exam, medical history, and your symptoms. You may have other tests to rule out other conditions that may be causing your symptoms. These may include:  Blood tests.  X-rays.  CT scan.  Endoscopy and colonoscopy. This is a test in which your GI tract is viewed with a long, thin, flexible tube.  How is this treated? There is no cure for IBS, but treatment can help relieve symptoms. IBS treatment often includes:  Changes to your diet, such as: ? Eating more fiber. ? Avoiding foods that cause symptoms. ? Drinking more water. ? Eating regular, medium-sized portioned meals.  Medicines. These may include: ? Fiber supplements if you have constipation. ? Medicine to control diarrhea (antidiarrheal medicines). ? Medicine to help control muscle spasms in your GI tract (antispasmodic medicines). ? Medicines to help with any mental health issues, such as antidepressants or tranquilizers.  Therapy. ? Talk therapy may help with anxiety, depression, or other mental health issues that can make IBS symptoms worse.  Stress reduction. ? Managing your stress can help keep symptoms under control.  Follow  these instructions at home:  Take medicines only as directed by your health care provider.  Eat a healthy diet. ? Avoid foods and drinks with added sugar. ? Include more whole grains, fruits, and vegetables gradually into your diet. This may be especially helpful if you have IBS with constipation. ? Avoid any foods and drinks that make your symptoms worse. These may include dairy products and caffeinated or carbonated drinks. ? Do not eat large meals. ? Drink enough fluid to keep your urine clear or pale yellow.  Exercise regularly. Ask your health care provider for recommendations of good activities for you.  Keep all follow-up visits as directed by your health care provider. This is important. Contact a health care provider if:  You have constant pain.  You have trouble or pain with swallowing.  You have worsening diarrhea. Get help right away if:  You have severe and worsening abdominal pain.  You have diarrhea and: ? You have a rash, stiff neck,  or severe headache. ? You are irritable, sleepy, or difficult to awaken. ? You are weak, dizzy, or extremely thirsty.  You have bright red blood in your stool or you have black tarry stools.  You have unusual abdominal swelling that is painful.  You vomit continuously.  You vomit blood (hematemesis).  You have both abdominal pain and a fever. This information is not intended to replace advice given to you by your health care provider. Make sure you discuss any questions you have with your health care provider. Document Released: 09/03/2005 Document Revised: 02/03/2016 Document Reviewed: 05/21/2014 Elsevier Interactive Patient Education  2018 [employer].
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
## 2018-12-05 ENCOUNTER — Ambulatory Visit: Payer: Self-pay | Admitting: *Deleted

## 2018-12-05 NOTE — Telephone Encounter (Signed)
Pt reports left sided chest pain x 2 months. Intermittent, radiates to "Stomach." States shooting pain,8/10,  does not occur daily. Resolves with resting and deep breathing.  Also reports mild SOB, "When working out." Reports nausea at times, "But I have this frequently with my IBS." Does not recall any injury, states "No change in my work outs." Appt made with Dr. Salomon Fick for Tuesday 12/09/2018. Care advise given per protocol.   Reason for Disposition . [1] Chest pain(s) lasting a few seconds AND [2] persists > 3 days  Answer Assessment - Initial Assessment Questions 1. LOCATION: "Where does it hurt?"       Left chest, shooting pain 2. RADIATION: "Does the pain go anywhere else?" (e.g., into neck, jaw, arms, back)     Down to stomach 3. ONSET: "When did the chest pain begin?" (Minutes, hours or days)      2 months ago 4. PATTERN "Does the pain come and go, or has it been constant since it started?"  "Does it get worse with exertion?"      Intermittent 5. DURATION: "How long does it last" (e.g., seconds, minutes, hours) Shooting, occurs almost daily     6. SEVERITY: "How bad is the pain?"  (e.g., Scale 1-10; mild, moderate, or severe)    - MILD (1-3): doesn't interfere with normal activities     - MODERATE (4-7): interferes with normal activities or awakens from sleep    - SEVERE (8-10): excruciating pain, unable to do any normal activities       8/10 7. CARDIAC RISK FACTORS: "Do you have any history of heart problems or risk factors for heart disease?" (e.g., prior heart attack, angina; high blood pressure, diabetes, being overweight, high cholesterol, smoking, or strong family history of heart disease)     no 8. PULMONARY RISK FACTORS: "Do you have any history of lung disease?"  (e.g., blood clots in lung, asthma, emphysema, birth control pills)     no 9. CAUSE: "What do you think is causing the chest pain?"     Not sure, maybe reflux 10. OTHER SYMPTOMS: "Do you have any other symptoms?"  (e.g., dizziness, nausea, vomiting, sweating, fever, difficulty breathing, cough)       SOB with working out; nausea when occurs, H/O IBS  Protocols used: CHEST PAIN-A-AH

## 2018-12-09 ENCOUNTER — Other Ambulatory Visit: Payer: Self-pay

## 2018-12-09 ENCOUNTER — Ambulatory Visit (INDEPENDENT_AMBULATORY_CARE_PROVIDER_SITE_OTHER): Payer: No Typology Code available for payment source | Admitting: Family Medicine

## 2018-12-09 ENCOUNTER — Encounter: Payer: Self-pay | Admitting: Family Medicine

## 2018-12-09 VITALS — BP 98/62 | HR 71 | Temp 98.3°F | Wt 236.0 lb

## 2018-12-09 DIAGNOSIS — J45990 Exercise induced bronchospasm: Secondary | ICD-10-CM | POA: Diagnosis not present

## 2018-12-09 DIAGNOSIS — J302 Other seasonal allergic rhinitis: Secondary | ICD-10-CM | POA: Diagnosis not present

## 2018-12-09 MED ORDER — ALBUTEROL SULFATE 108 (90 BASE) MCG/ACT IN AEPB: 2.0000 | INHALATION_SPRAY | Freq: Four times a day (QID) | RESPIRATORY_TRACT | 4 refills | Status: DC | PRN

## 2018-12-09 NOTE — Patient Instructions (Signed)
Exercise-Induced Bronchoconstriction, Adult  Exercise-induced bronchospasm (EIB) happens when the airways narrow during or after exercise. The airways are the passages that lead from the nose and mouth down into the lungs. When the airways narrow, this can cause coughing, wheezing, and shortness of breath. Anyone can develop this condition, even those who do not have asthma or allergies. To help prevent episodes of EIB, you may need to take medicine or change your workout routine. You should tell your coach, teammates, or workout partners about your condition so they know how to help you if you do have an episode. What are the causes? The exact cause of this condition is not known. Symptoms are brought on (triggered) by physical activity. EIB can also be triggered by dry air or by allergens and irritants, such as the chemicals used in pools and skating rinks. What increases the risk? The following factors may make you more likely to develop this condition:  Having asthma.  Exercising in dry air.  Exercising outdoors during allergy season.  Playing an outdoor sport that requires continuous motion. This includes sports such as soccer, hockey, and cross-country skiing. What are the signs or symptoms? Symptoms of this condition include:  Wheezing.  Coughing.  Shortness of breath.  Tightness in the chest.  Sore throat.  Upset stomach. How is this diagnosed? This condition may be diagnosed based on your symptoms, your medical history, and a physical exam. A test may be done to measure how exercise affects your breathing (spirometry test). For this test, you breathe into a device before and after exercising. How is this treated? Treatment for this condition may include:  Changing your exercise routine. You may have to: ? Spend a few minutes warming up before your workout. ? Exercise indoors when the air is dry or during allergy season.  Taking medicine. Your health care provider may  prescribe: ? An inhaler for you to use before you exercise. ? Oral medicine to control allergies and asthma. ? Inhaled steroids. Follow these instructions at home:  Take over-the-counter and prescription medicines only as told by your health care provider.  Keep all bronchospasm medicine with you during your workout.  Make changes in your workout as told by your health care provider.  Wear a medical ID bracelet. Tell your coach, trainer, or teammates about your condition.  If you are planning to exercise alone or in an isolated area, let someone know where you are going and when you will be back.  Keep all follow-up visits as told by your health care provider. This is important. How is this prevented?  Take medicines to prevent exercise-induced bronchospasm as told by your health care provider. Work with Transport planner to make changes to your workout as needed.  If dry air triggers exercise-induced bronchospasm: ? Exercise indoors during peak allergy season and on days that are dry or cold. ? Try to breathe in warm, moist air by wearing a scarf over your nose and mouth or breathing only through your nose. Contact a health care provider if:  You have coughing, wheezing, or shortness of breath that continues after treatment.  Your coughing wakes you up at night.  You have less endurance than you used to. Get help right away if:  You cannot catch your breath.  You pass out. This information is not intended to replace advice given to you by your health care provider. Make sure you discuss any questions you have with your health care provider. Document Released: 09/03/2005  Document Revised: 10/29/2016 Document Reviewed: 05/11/2015 Elsevier Interactive Patient Education  2019 [employer]  Allergies, Adult An allergy means that your body reacts to something that bothers it (allergen). It is not a normal reaction. This can happen from something that you:  Eat.  Breathe  in.  Touch. You can have an allergy (be allergic) to:  Outdoor things, like: ? Pollen. ? Grass. ? Weeds.  Indoor things, like: ? Dust. ? Smoke. ? Pet dander.  Foods.  Medicines.  Things that bother your skin, like: ? Detergents. ? Chemicals. ? Latex.  Perfume.  Bugs. An allergy cannot spread from person to person (is not contagious). Follow these instructions at home:         Stay away from things that you know you are allergic to.  If you have allergies to things in the air, wash out your nose each day. Do it with one of these: ? A salt-water (saline) spray. ? A container (neti pot).  Take over-the-counter and prescription medicines only as told by your doctor.  Keep all follow-up visits as told by your doctor. This is important.  If you are at risk for a very bad allergy reaction (anaphylaxis), keep an auto-injector with you all the time. This is called an epinephrine injection. ? This is pre-measured medicine with a needle. You can put it into your skin by yourself. ? Right after you have a very bad allergy reaction, you or a person with you must give the medicine in less than a few minutes. This is an emergency.  If you have ever had a very bad allergy reaction, wear a medical alert bracelet or necklace. Your very bad allergy should be written on it. Contact a health care provider if:  Your symptoms do not get better with treatment. Get help right away if:  You have symptoms of a very bad allergy reaction. These include: ? A swollen mouth, tongue, or throat. ? Pain or tightness in your chest. ? Trouble breathing. ? Being short of breath. ? Dizziness. ? Fainting. ? Very bad pain in your belly (abdomen). ? Throwing up (vomiting). ? Watery poop (diarrhea). Summary  An allergy means that your body reacts to something that bothers it (allergen). It is not a normal reaction.  Stay away from things that make your body react.  Take over-the-counter  and prescription medicines only as told by your doctor.  If you are at risk for a very bad allergy reaction, carry an auto-injector (epinephrine injection) all the time. Also, wear a medical alert bracelet or necklace so people know about your allergy. This information is not intended to replace advice given to you by your health care provider. Make sure you discuss any questions you have with your health care provider. Document Released: 12/29/2012 Document Revised: 12/17/2016 Document Reviewed: 12/17/2016 Elsevier Interactive Patient Education  2019 [employer].

## 2018-12-09 NOTE — Progress Notes (Signed)
Subjective:    Patient ID: Mark Wade, male    DOB: 03-18-1996, 23 y.o.   MRN: [id number]  No chief complaint on file.   HPI Patient was seen today for acute concern.  Pt endorses sharp pain in left chest, coughing, shortness of breath with exercising off-and-on x1 month.  Pt endorses h/o asthma with symptoms more as a child.  Pt does not have an albuterol inhaler.  Pt endorses daily MJ use (4 blunts/d).  Pt also has a h/o seasonal allergies, not currently taking anything.  Pt also endorses increased cough at night with postnasal drainage.  Pt denies headaches, ear pain/pressure, facial pain/pressure, sore throat, fever, chills.  Pt denies sick contacts.  States has been in the house for the last 2 weeks.  Past Medical History:  Diagnosis Date  . Asthma   . Frequent headaches   . Irritable bowel syndrome (IBS)   . Migraine     Allergies  Allergen Reactions  . Other     Allergic to walnuts and pecans- throat swells up.  . Pollen Extract Itching    Watery eyes     ROS General: Denies fever, chills, night sweats, changes in weight, changes in appetite HEENT: Denies headaches, ear pain, changes in vision, rhinorrhea, sore throat  +post nasal drainage CV: Denies CP, palpitations, orthopnea  +SOB Pulm: Denies wheezing  +cough, SOB GI: Denies abdominal pain, nausea, vomiting, diarrhea, constipation GU: Denies dysuria, hematuria, frequency, vaginal discharge Msk: Denies muscle cramps, joint pains Neuro: Denies weakness, numbness, tingling Skin: Denies rashes, bruising Psych: Denies depression, anxiety, hallucinations    Objective:    Blood pressure 98/62, pulse 71, temperature 98.3 F (36.8 C), temperature source Oral, weight 236 lb (107 kg), SpO2 99 %.  Gen. Pleasant, well-nourished, in no distress, normal affect   HEENT: [place]/AT, face symmetric, no scleral icterus, PERRLA, nares patent without drainage, pharynx without erythema or exudate.  TMs normal b/l. Lungs: no accessory  muscle use, CTAB, no wheezes or rales Cardiovascular: RRR, no m/r/g, no peripheral edema Musculoskeletal: No TTP of chest wall.  No deformities, no cyanosis or clubbing, normal tone Neuro:  A&Ox3, CN II-XII intact, normal gait Skin:  Warm, no lesions/ rash  Wt Readings from Last 3 Encounters:  06/13/18 210 lb (95.3 kg)  05/24/18 200 lb (90.7 kg)  07/18/17 200 lb (90.7 kg)    Lab Results  Component Value Date   WBC 7.5 01/02/2017   HGB 14.5 01/02/2017   HCT 39.9 01/02/2017   PLT 181 01/02/2017   GLUCOSE 128 (H) 01/02/2017   ALT 24 10/15/2014   AST 20 10/15/2014   NA 139 01/02/2017   K 3.5 01/02/2017   CL 101 01/02/2017   CREATININE 0.89 01/02/2017   BUN 11 01/02/2017   CO2 29 01/02/2017    Assessment/Plan:  Mild exercise-induced asthma -Patient advised to stop smoking -Given handout - Plan: Albuterol Sulfate (PROAIR RESPICLICK) 108 (90 Base) MCG/ACT AEPB  Seasonal allergies -Discussed restarting allergy medication -Patient given samples of Zyrtec -Patient to obtain OTC allergy medication such as Zyrtec, Claritin, Allegra, Xyzal -Given handout  F/u prn  Abbe Amsterdam, MD

## 2018-12-11 ENCOUNTER — Telehealth: Payer: Self-pay | Admitting: Family Medicine

## 2018-12-11 ENCOUNTER — Other Ambulatory Visit: Payer: Self-pay

## 2018-12-11 DIAGNOSIS — J45990 Exercise induced bronchospasm: Secondary | ICD-10-CM

## 2018-12-11 MED ORDER — ALBUTEROL SULFATE 108 (90 BASE) MCG/ACT IN AEPB: 2.0000 | INHALATION_SPRAY | Freq: Four times a day (QID) | RESPIRATORY_TRACT | 4 refills | Status: DC | PRN

## 2018-12-11 NOTE — Telephone Encounter (Signed)
Rx was resent to [facility], pt is aware

## 2018-12-11 NOTE — Telephone Encounter (Signed)
Copied from CRM [id number]. Topic: Quick Communication - Rx Refill/Question >> Dec 11, 2018  8:51 AM Tamela Oddi wrote: Medication: Albuterol Sulfate (PROAIR RESPICLICK) 108 (90 Base) MCG/ACT AEPB  Patient called to request a refill for the above medication  Preferred Pharmacy (with phone number or street name): [facility] - [place], [place] - [address] [place] [phone] (Phone) [phone] (Fax)

## 2018-12-20 ENCOUNTER — Telehealth: Payer: Self-pay | Admitting: Family Medicine

## 2018-12-20 NOTE — Telephone Encounter (Signed)
[facility] left message with patient engagement center requesting inhaler change.  Current rx is not available.

## 2018-12-24 NOTE — Telephone Encounter (Signed)
Did the pharmacy state which inhaler is available?

## 2018-12-24 NOTE — Telephone Encounter (Signed)
Please advise 

## 2018-12-29 NOTE — Telephone Encounter (Signed)
Spoke with [facility] regarding pt Rx for inhaler stated that Rx was refilled but pt had not picked up, pt aware to pick up Rx

## 2019-02-13 ENCOUNTER — Other Ambulatory Visit: Payer: Self-pay

## 2019-02-13 ENCOUNTER — Encounter: Payer: Self-pay | Admitting: Family Medicine

## 2019-02-13 ENCOUNTER — Ambulatory Visit (INDEPENDENT_AMBULATORY_CARE_PROVIDER_SITE_OTHER): Payer: 59 | Admitting: Family Medicine

## 2019-02-13 DIAGNOSIS — B353 Tinea pedis: Secondary | ICD-10-CM | POA: Diagnosis not present

## 2019-02-13 DIAGNOSIS — K219 Gastro-esophageal reflux disease without esophagitis: Secondary | ICD-10-CM | POA: Diagnosis not present

## 2019-02-13 MED ORDER — TERBINAFINE HCL 1 % EX CREA: 1.0000 "application " | TOPICAL_CREAM | Freq: Two times a day (BID) | CUTANEOUS | 0 refills | Status: AC

## 2019-02-13 MED ORDER — OMEPRAZOLE 20 MG PO CPDR: 20.0000 mg | DELAYED_RELEASE_CAPSULE | Freq: Every day | ORAL | 3 refills | Status: DC

## 2019-02-13 MED FILL — OMEPRAZOLE 20 MG CPDR: 20 | 30 days supply | Qty: 30 | Fill #0

## 2019-02-13 NOTE — Progress Notes (Signed)
Virtual Visit via Video Note Connection with increased static.  I connected with Mark Wade on 02/13/19 at  9:00 AM EDT by a video enabled telemedicine application and verified that I am speaking with the correct person using two identifiers.  Location patient: in car.  Was driving.  Advised to pull over for e-visit. Location provider:work or home office Persons participating in the virtual visit: patient, provider  I discussed the limitations of evaluation and management by telemedicine and the availability of in person appointments. The patient expressed understanding and agreed to proceed.   HPI: Pt is a 23 yo male with pmh sig for tinea pedis, h/o migraines, asthma, IBS.   Pt having increased acid reflux.  Feels like "food is not digesting".  Has increased nausea and burning in stomach.  Had EGD but states the study was normal.  Has not followed up with GI since procedure.  Pt has tried anti-nausea meds and antacids but cannot recall the names.  States was not taking them consistently.  Pt notes more frequent symptoms after eating.  Still having issues with athlete's foot.  Having itching and peeling between toes and on bottom of feet.  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  Tries to keep feel clean and dry.   ROS: See pertinent positives and negatives per HPI.  Past Medical History:  Diagnosis Date  . Asthma   . Frequent headaches   . Irritable bowel syndrome (IBS)   . Migraine     Past Surgical History:  Procedure Laterality Date  . ESOPHAGOGASTRODUODENOSCOPY ENDOSCOPY      No family history on file.  SOCIAL HX:    Current Outpatient Medications:  .  Albuterol Sulfate (PROAIR RESPICLICK) 108 (90 Base) MCG/ACT AEPB, Inhale 2 puffs into the lungs every 6 (six) hours as needed., Disp: 1 each, Rfl: 4  EXAM:  VITALS per patient if applicable:  RR between 12-20 bpm  GENERAL: alert, oriented, appears well and in no acute distress  HEENT:  atraumatic, conjunctiva clear, no obvious abnormalities on inspection of external nose and ears  NECK: normal movements of the head and neck  LUNGS: on inspection no signs of respiratory distress, breathing rate appears normal, no obvious gross SOB, gasping or wheezing  CV: no obvious cyanosis  MS: moves all visible extremities without noticeable abnormality  PSYCH/NEURO: pleasant and cooperative, no obvious depression or anxiety, speech and thought processing grossly intact  ASSESSMENT AND PLAN:  Discussed the following assessment and plan:  Gastroesophageal reflux disease without esophagitis  -pt encouraged to f/u with GI -discussed taking omperazole daily. -advised to avoid foods known to cause symptoms - Plan: omeprazole (PRILOSEC) 20 MG capsule  Tinea pedis of both feet  - Plan: Ambulatory referral to Podiatry, terbinafine (LAMISIL) 1 % cream  F/u prn   I discussed the assessment and treatment plan with the patient. The patient was provided an opportunity to ask questions and all were answered. The patient agreed with the plan and demonstrated an understanding of the instructions.   The patient was advised to call back or seek an in-person evaluation if the symptoms worsen or if the condition fails to improve as anticipated.   Deeann Saint, MD

## 2019-02-16 ENCOUNTER — Telehealth: Payer: Self-pay | Admitting: Family Medicine

## 2019-02-16 NOTE — Telephone Encounter (Signed)
Ok to purchase the OTC cream

## 2019-02-16 NOTE — Telephone Encounter (Signed)
Please advise 

## 2019-02-16 NOTE — Telephone Encounter (Unsigned)
Copied from CRM [id number]. Topic: Quick Communication - Rx Refill/Question >> Feb 16, 2019  1:29 PM Wyonia Hough E wrote: Medication: terbinafine (LAMISIL) 1 % cream - the pharmacy does not carry this medication and advise Pt that he can but this over the counter. Pt wants to speak with Dr. Salomon Fick nurse to know if he should purchase the over the counter cream or if another Rx can be sent in for something else/ please advise

## 2019-02-17 NOTE — Telephone Encounter (Signed)
Noted  

## 2019-02-17 NOTE — Telephone Encounter (Signed)
Pt called back regarding this message. I relayed message Dr. Salomon Fick left. Pt stated understanding.

## 2019-02-26 ENCOUNTER — Other Ambulatory Visit: Payer: Self-pay | Admitting: *Deleted

## 2019-02-26 ENCOUNTER — Encounter: Payer: Self-pay | Admitting: *Deleted

## 2019-02-26 ENCOUNTER — Encounter: Payer: Self-pay | Admitting: Podiatry

## 2019-02-26 ENCOUNTER — Ambulatory Visit (INDEPENDENT_AMBULATORY_CARE_PROVIDER_SITE_OTHER): Payer: 59 | Admitting: Podiatry

## 2019-02-26 ENCOUNTER — Other Ambulatory Visit: Payer: Self-pay

## 2019-02-26 DIAGNOSIS — B351 Tinea unguium: Secondary | ICD-10-CM | POA: Diagnosis not present

## 2019-02-26 DIAGNOSIS — B353 Tinea pedis: Secondary | ICD-10-CM

## 2019-02-26 DIAGNOSIS — Z79899 Other long term (current) drug therapy: Secondary | ICD-10-CM | POA: Diagnosis not present

## 2019-02-26 NOTE — Patient Instructions (Signed)
Onychomycosis/Fungal Toenails  WHAT IS IT? An infection that lies within the keratin of your nail plate that is caused by a fungus.  WHY ME? Fungal infections affect all ages, sexes, races, and creeds.  There may be many factors that predispose you to a fungal infection such as age, coexisting medical conditions such as diabetes, or an autoimmune disease; stress, medications, fatigue, genetics, etc.  Bottom line: fungus thrives in a warm, moist environment and your shoes offer such a location.  IS IT CONTAGIOUS? Theoretically, yes.  You do not want to share shoes, nail clippers or files with someone who has fungal toenails.  Walking around barefoot in the same room or sleeping in the same bed is unlikely to transfer the organism.  It is important to realize, however, that fungus can spread easily from one nail to the next on the same foot.  HOW DO WE TREAT THIS?  There are several ways to treat this condition.  Treatment may depend on many factors such as age, medications, pregnancy, liver and kidney conditions, etc.  It is best to ask your doctor which options are available to you.  1. No treatment.   Unlike many other medical concerns, you can live with this condition.  However for many people this can be a painful condition and may lead to ingrown toenails or a bacterial infection.  It is recommended that you keep the nails cut short to help reduce the amount of fungal nail. 2. Topical treatment.  These range from herbal remedies to prescription strength nail lacquers.  About 40-50% effective, topicals require twice daily application for approximately 9 to 12 months or until an entirely new nail has grown out.  The most effective topicals are medical grade medications available through physicians offices. 3. Oral antifungal medications.  With an 80-90% cure rate, the most common oral medication requires 3 to 4 months of therapy and stays in your system for a year as the new nail grows out.  Oral  antifungal medications do require blood work to make sure it is a safe drug for you.  A liver function panel will be performed prior to starting the medication and after the first month of treatment.  It is important to have the blood work performed to avoid any harmful side effects.  In general, this medication safe but blood work is required. 4. Laser Therapy.  This treatment is performed by applying a specialized laser to the affected nail plate.  This therapy is noninvasive, fast, and non-painful.  It is not covered by insurance and is therefore, out of pocket.  The results have been very good with a 80-95% cure rate.  [facility] is the only practice in the area to offer this therapy. Permanent Nail Avulsion.  Removing the entire nail so that a new nail will not grow back.Athlete's Foot  Athlete's foot (tinea pedis) is a fungal infection of the skin on your feet. It often occurs on the skin that is between or underneath your toes. It can also occur on the soles of your feet. Symptoms include itchy or white and flaky areas on the skin. The infection can spread from person to person (is contagious). It can also spread when a person's bare feet come in contact with the fungus on shower floors or on items such as shoes. Follow these instructions at home: Medicines  Apply or take over-the-counter and prescription medicines only as told by your doctor.  Apply your antifungal medicine as told  by your doctor. Do not stop using the medicine even if your feet start to get better. Foot care  Do not scratch your feet.  Keep your feet dry: ? Wear cotton or wool socks. Change your socks every day or if they become wet. ? Wear shoes that allow air to move around, such as sandals or canvas tennis shoes.  Wash and dry your feet: ? Every day or as told by your doctor. ? After exercising. ? Including the area between your toes. General instructions  Do not share any of these items that touch  your feet: ? Towels. ? Shoes. ? Nail clippers. ? Other personal items.  Protect your feet by wearing sandals in wet areas, such as locker rooms and shared showers.  Keep all follow-up visits as told by your doctor. This is important.  If you have diabetes, keep your blood sugar under control. Contact a doctor if:  You have a fever.  You have swelling, pain, warmth, or redness in your foot.  Your feet are not getting better with treatment.  Your symptoms get worse.  You have new symptoms. Summary  Athlete's foot is a fungal infection of the skin on your feet.  Symptoms include itchy or white and flaky areas on the skin.  Apply your antifungal medicine as told by your doctor.  Keep your feet clean and dry. This information is not intended to replace advice given to you by your health care provider. Make sure you discuss any questions you have with your health care provider. Document Released: 02/20/2008 Document Revised: 06/24/2017 Document Reviewed: 06/24/2017 Elsevier Interactive Patient Education  2019 Elsevier Inc.   Terbinafine oral granules What is this medicine? TERBINAFINE (TER bin a feen) is an antifungal medicine. It is used to treat certain kinds of fungal or yeast infections. This medicine may be used for other purposes; ask your health care provider or pharmacist if you have questions. COMMON BRAND NAME(S): Lamisil What should I tell my health care provider before I take this medicine? They need to know if you have any of these conditions: -drink alcoholic beverages -kidney disease -liver disease -an unusual or allergic reaction to Terbinafine, other medicines, foods, dyes, or preservatives -pregnant or trying to get pregnant -breast-feeding How should I use this medicine? Take this medicine by mouth. Follow the directions on the prescription label. Hold packet with cut line on top. Shake packet gently to settle contents. Tear packet open along cut line,  or use scissors to cut across line. Carefully pour the entire contents of packet onto a spoonful of a soft food, such as pudding or other soft, non-acidic food such as mashed potatoes (do NOT use applesauce or a fruit-based food). If two packets are required for each dose, you may either sprinkle the content of both packets on one spoonful of non-acidic food, or sprinkle the contents of both packets on two spoonfuls of non-acidic food. Make sure that no granules remain in the packet. Swallow the mxiture of the food and granules without chewing. Take your medicine at regular intervals. Do not take it more often than directed. Take all of your medicine as directed even if you think you are better. Do not skip doses or stop your medicine early. Contact your pediatrician or health care professional regarding the use of this medicine in children. While this medicine may be prescribed for children as young as 4 years for selected conditions, precautions do apply. Overdosage: If you think you have taken too much  of this medicine contact a poison control center or emergency room at once. NOTE: This medicine is only for you. Do not share this medicine with others. What if I miss a dose? If you miss a dose, take it as soon as you can. If it is almost time for your next dose, take only that dose. Do not take double or extra doses. What may interact with this medicine? Do not take this medicine with any of the following medications: -thioridazine This medicine may also interact with the following medications: -beta-blockers -caffeine -cimetidine -cyclosporine -MAOIs like Carbex, Eldepryl, Marplan, Nardil, and Parnate -medicines for fungal infections like fluconazole and ketoconazole -medicines for irregular heartbeat like amiodarone, flecainide and propafenone -rifampin -SSRIs like citalopram, escitalopram, fluoxetine, fluvoxamine, paroxetine and sertraline -tricyclic antidepressants like amitriptyline,  clomipramine, desipramine, imipramine, nortriptyline, and others -warfarin This list may not describe all possible interactions. Give your health care provider a list of all the medicines, herbs, non-prescription drugs, or dietary supplements you use. Also tell them if you smoke, drink alcohol, or use illegal drugs. Some items may interact with your medicine. What should I watch for while using this medicine? Your doctor may monitor your liver function. Tell your doctor right away if you have nausea or vomiting, loss of appetite, stomach pain on your right upper side, yellow skin, dark urine, light stools, or are over tired. You need to take this medicine for 6 weeks or longer to cure the fungal infection. Take your medicine regularly for as long as your doctor or health care professional tells you to. What side effects may I notice from receiving this medicine? Side effects that you should report to your doctor or health care professional as soon as possible: -allergic reactions like skin rash or hives, swelling of the face, lips, or tongue -change in vision -dark urine -fever or infection -general ill feeling or flu-like symptoms -light-colored stools -loss of appetite, nausea -redness, blistering, peeling or loosening of the skin, including inside the mouth -right upper belly pain -unusually weak or tired -yellowing of the eyes or skin Side effects that usually do not require medical attention (report to your doctor or health care professional if they continue or are bothersome): -changes in taste -diarrhea -hair loss -muscle or joint pain -stomach upset This list may not describe all possible side effects. Call your doctor for medical advice about side effects. You may report side effects to FDA at 1-800-FDA-1088. Where should I keep my medicine? Keep out of the reach of children. Store at room temperature between 15 and 30 degrees C (59 and 86 degrees F). Throw away any unused  medicine after the expiration date. NOTE: This sheet is a summary. It may not cover all possible information. If you have questions about this medicine, talk to your doctor, pharmacist, or health care provider.  2019 Elsevier/Gold Standard (2007-11-14 17:25:48)

## 2019-03-04 ENCOUNTER — Other Ambulatory Visit: Payer: Self-pay | Admitting: Podiatry

## 2019-03-04 LAB — CBC WITH DIFFERENTIAL/PLATELET
Absolute Monocytes: 420 cells/uL (ref 200–950)
Basophils Absolute: 34 cells/uL (ref 0–200)
Basophils Relative: 0.4 %
Eosinophils Absolute: 101 cells/uL (ref 15–500)
Eosinophils Relative: 1.2 %
HCT: 41 % (ref 38.5–50.0)
Hemoglobin: 14 g/dL (ref 13.2–17.1)
Lymphs Abs: 2108 cells/uL (ref 850–3900)
MCH: 30.5 pg (ref 27.0–33.0)
MCHC: 34.1 g/dL (ref 32.0–36.0)
MCV: 89.3 fL (ref 80.0–100.0)
MPV: 11.3 fL (ref 7.5–12.5)
Monocytes Relative: 5 %
Neutro Abs: 5737 cells/uL (ref 1500–7800)
Neutrophils Relative %: 68.3 %
Platelets: 204 10*3/uL (ref 140–400)
RBC: 4.59 10*6/uL (ref 4.20–5.80)
RDW: 13.5 % (ref 11.0–15.0)
Total Lymphocyte: 25.1 %
WBC: 8.4 10*3/uL (ref 3.8–10.8)

## 2019-03-04 LAB — HEPATIC FUNCTION PANEL
AG Ratio: 1.6 (calc) (ref 1.0–2.5)
ALT: 24 U/L (ref 9–46)
AST: 20 U/L (ref 10–40)
Albumin: 4.6 g/dL (ref 3.6–5.1)
Alkaline phosphatase (APISO): 48 U/L (ref 36–130)
Bilirubin, Direct: 0.1 mg/dL (ref 0.0–0.2)
Globulin: 2.8 g/dL (calc) (ref 1.9–3.7)
Indirect Bilirubin: 0.5 mg/dL (calc) (ref 0.2–1.2)
Total Bilirubin: 0.6 mg/dL (ref 0.2–1.2)
Total Protein: 7.4 g/dL (ref 6.1–8.1)

## 2019-03-04 MED ORDER — TERBINAFINE HCL 250 MG PO TABS: 250.0000 mg | ORAL_TABLET | Freq: Every day | ORAL | 0 refills | Status: DC

## 2019-03-04 NOTE — Progress Notes (Signed)
Subjective:   Patient ID: Mark Wade, male   DOB: 23 y.o.   MRN: [id number]   HPI 23 year old male presents the office with concerns of athlete's foot as well as nail fungus.  He states he is used over-the-counter medicine as well as Lamisil cream without any significant improvement.  He does state he has been itchy at times.  He states that his feet also sweat.  No open sores or drainage.  No pustules.   Review of Systems  All other systems reviewed and are negative.  Past Medical History:  Diagnosis Date  . Asthma   . Frequent headaches   . Irritable bowel syndrome (IBS)   . Migraine     Past Surgical History:  Procedure Laterality Date  . ESOPHAGOGASTRODUODENOSCOPY ENDOSCOPY       Current Outpatient Medications:  .  omeprazole (PRILOSEC) 20 MG capsule, Take 1 capsule (20 mg total) by mouth daily., Disp: 30 capsule, Rfl: 3 .  terbinafine (LAMISIL) 250 MG tablet, Take 1 tablet (250 mg total) by mouth daily., Disp: 90 tablet, Rfl: 0  Allergies  Allergen Reactions  . Other     Allergic to walnuts and pecans- throat swells up.  . Pollen Extract Itching    Watery eyes          Objective:  Physical Exam  General: AAO x3, NAD  Dermatological: Nails appear to be hypertrophic, dystrophic with yellow-brown discoloration.  No pain.  No redness drainage or swelling.  Athlete's foot present interdigitally as well as the plantar aspect of feet.  No skin fissures or open sores.  No drainage.  Vascular: Dorsalis Pedis artery and Posterior Tibial artery pedal pulses are 2/4 bilateral with immedate capillary fill time. Pedal hair growth present. No varicosities and no lower extremity edema present bilateral. There is no pain with calf compression, swelling, warmth, erythema.   Neruologic: Grossly intact via light touch bilateral. Vibratory intact via tuning fork bilateral. Protective threshold with Semmes Wienstein monofilament intact to all pedal sites bilateral. Patellar and  Achilles deep tendon reflexes 2+ bilateral. No Babinski or clonus noted bilateral.   Musculoskeletal: No gross boney pedal deformities bilateral. No pain, crepitus, or limitation noted with foot and ankle range of motion bilateral. Muscular strength 5/5 in all groups tested bilateral.  Gait: Unassisted, Nonantalgic.       Assessment:   Onychomycosis, tinea pedis     Plan:  -Treatment options discussed including all alternatives, risks, and complications -Etiology of symptoms were discussed -We discussed treatment options and both issues.  After discussion was to proceed with oral therapy.  He wants to proceed with Lamisil. I ordered a CBC and LFT. Once this comes back I will order the Lamisil.   Trula Slade DPM

## 2019-03-05 ENCOUNTER — Telehealth: Payer: Self-pay | Admitting: *Deleted

## 2019-03-05 MED ORDER — TERBINAFINE HCL 250 MG PO TABS: 250.0000 mg | ORAL_TABLET | Freq: Every day | ORAL | 0 refills | Status: DC

## 2019-03-05 NOTE — Telephone Encounter (Signed)
-----   Message from Trula Slade, DPM sent at 03/04/2019 10:30 AM EDT ----- Val- please let him know that the blood work is normal and I have sent over the lamisil. Please follow up in 6 weeks.

## 2019-03-05 NOTE — Telephone Encounter (Signed)
Pt called and asked if the blood work had been reviewed and if he could begin the medication. I informed pt the blood work had been reviewed and was normal, Dr. Jacqualyn Posey had ordered the medication at [facility]. Pt states he would like the [facility]. I changed the pharmacy and reminded pt of her 04/09/2019 8:45am appt.

## 2019-03-10 MED FILL — TERBINAFINE HCL 250 MG TAB: 250 | 30 days supply | Qty: 30 | Fill #0

## 2019-03-11 ENCOUNTER — Telehealth: Payer: Self-pay

## 2019-03-11 NOTE — Telephone Encounter (Signed)
[facility] for Referral

## 2019-03-11 NOTE — Telephone Encounter (Signed)
Copied from [place] [phone]. Topic: Referral - Request for Referral >> Mar 11, 2019 12:57 PM Nils Flack wrote: Has patient seen PCP for this complaint? Yes.   *If NO, is insurance requiring patient see PCP for this issue before PCP can refer them? Referral for which specialty: Gertie Fey Preferred provider/office: [facility] - needs one that takes both insurance [facility] and [place]) Reason for referral: IBS Cb is [phone]

## 2019-03-12 NOTE — Telephone Encounter (Signed)
ok 

## 2019-03-13 ENCOUNTER — Other Ambulatory Visit: Payer: Self-pay

## 2019-03-13 DIAGNOSIS — K589 Irritable bowel syndrome without diarrhea: Secondary | ICD-10-CM

## 2019-03-13 NOTE — Telephone Encounter (Signed)
Referral has been placed. 

## 2019-04-09 ENCOUNTER — Other Ambulatory Visit: Payer: Self-pay

## 2019-04-09 ENCOUNTER — Encounter: Payer: Self-pay | Admitting: Podiatry

## 2019-04-09 ENCOUNTER — Ambulatory Visit (INDEPENDENT_AMBULATORY_CARE_PROVIDER_SITE_OTHER): Payer: 59 | Admitting: Podiatry

## 2019-04-09 VITALS — Temp 98.3°F

## 2019-04-09 DIAGNOSIS — B353 Tinea pedis: Secondary | ICD-10-CM

## 2019-04-09 DIAGNOSIS — Z79899 Other long term (current) drug therapy: Secondary | ICD-10-CM

## 2019-04-09 DIAGNOSIS — B351 Tinea unguium: Secondary | ICD-10-CM | POA: Diagnosis not present

## 2019-04-09 NOTE — Progress Notes (Signed)
Subjective: 23 year old male presents the office for follow-up evaluation of nail fungus, athlete's foot.  He is currently on Lamisil.  He denies any side effects and he did not refill a difference in his toenails.  He has no new concerns. Denies any systemic complaints such as fevers, chills, nausea, vomiting. No acute changes since last appointment, and no other complaints at this time.   Objective: AAO x3, NAD DP/PT pulses palpable bilaterally, CRT less than 3 seconds There is some mild clear on the proximal edge of the nail borders.  There is no pain in the nails there is no redness or drainage.  Athlete's foot is much improved.  No open sores or pustules or skin fissures. No open lesions or pre-ulcerative lesions.  No pain with calf compression, swelling, warmth, erythema  Assessment: Onychomycosis, tinea pedis with improvement-currently on Lamisil  Plan: -All treatment options discussed with the patient including all alternatives, risks, complications.  -Overall he is doing well.  Would continue Lamisil.  Recheck a CBC and LFT. We will complete the 106-month course.  We will see him back towards the end of the 3 month course to see if we need to consider extending the medication. If he is doing well will see him back prn.  -As a courtesy I debrided the nails x 10 without complications or bleeding.  -Patient encouraged to call the office with any questions, concerns, change in symptoms.   Trula Slade DPM

## 2019-04-09 NOTE — Patient Instructions (Signed)
Terbinafine oral granules What is this medicine? TERBINAFINE (TER bin a feen) is an antifungal medicine. It is used to treat certain kinds of fungal or yeast infections. This medicine may be used for other purposes; ask your health care provider or pharmacist if you have questions. COMMON BRAND NAME(S): Lamisil What should I tell my health care provider before I take this medicine? They need to know if you have any of these conditions:  drink alcoholic beverages  kidney disease  liver disease  an unusual or allergic reaction to Terbinafine, other medicines, foods, dyes, or preservatives  pregnant or trying to get pregnant  breast-feeding How should I use this medicine? Take this medicine by mouth. Follow the directions on the prescription label. Hold packet with cut line on top. Shake packet gently to settle contents. Tear packet open along cut line, or use scissors to cut across line. Carefully pour the entire contents of packet onto a spoonful of a soft food, such as pudding or other soft, non-acidic food such as mashed potatoes (do NOT use applesauce or a fruit-based food). If two packets are required for each dose, you may either sprinkle the content of both packets on one spoonful of non-acidic food, or sprinkle the contents of both packets on two spoonfuls of non-acidic food. Make sure that no granules remain in the packet. Swallow the mxiture of the food and granules without chewing. Take your medicine at regular intervals. Do not take it more often than directed. Take all of your medicine as directed even if you think you are better. Do not skip doses or stop your medicine early. Contact your pediatrician or health care professional regarding the use of this medicine in children. While this medicine may be prescribed for children as young as 4 years for selected conditions, precautions do apply. Overdosage: If you think you have taken too much of this medicine contact a poison control  center or emergency room at once. NOTE: This medicine is only for you. Do not share this medicine with others. What if I miss a dose? If you miss a dose, take it as soon as you can. If it is almost time for your next dose, take only that dose. Do not take double or extra doses. What may interact with this medicine? Do not take this medicine with any of the following medications:  thioridazine This medicine may also interact with the following medications:  beta-blockers  caffeine  cimetidine  cyclosporine  MAOIs like Carbex, Eldepryl, Marplan, Nardil, and Parnate  medicines for fungal infections like fluconazole and ketoconazole  medicines for irregular heartbeat like amiodarone, flecainide and propafenone  rifampin  SSRIs like citalopram, escitalopram, fluoxetine, fluvoxamine, paroxetine and sertraline  tricyclic antidepressants like amitriptyline, clomipramine, desipramine, imipramine, nortriptyline, and others  warfarin This list may not describe all possible interactions. Give your health care provider a list of all the medicines, herbs, non-prescription drugs, or dietary supplements you use. Also tell them if you smoke, drink alcohol, or use illegal drugs. Some items may interact with your medicine. What should I watch for while using this medicine? Your doctor may monitor your liver function. Tell your doctor right away if you have nausea or vomiting, loss of appetite, stomach pain on your right upper side, yellow skin, dark urine, light stools, or are over tired. This medicine may cause serious skin reactions. They can happen weeks to months after starting the medicine. Contact your health care provider right away if you notice fevers or flu-like symptoms   with a rash. The rash may be red or purple and then turn into blisters or peeling of the skin. Or, you might notice a red rash with swelling of the face, lips or lymph nodes in your neck or under your arms. You need to take  this medicine for 6 weeks or longer to cure the fungal infection. Take your medicine regularly for as long as your doctor or health care provider tells you to. What side effects may I notice from receiving this medicine? Side effects that you should report to your doctor or health care professional as soon as possible:  allergic reactions like skin rash or hives, swelling of the face, lips, or tongue  change in vision  dark urine  fever or infection  general ill feeling or flu-like symptoms  light-colored stools  loss of appetite, nausea  rash, fever, and swollen lymph nodes  redness, blistering, peeling or loosening of the skin, including inside the mouth  right upper belly pain  unusually weak or tired  yellowing of the eyes or skin Side effects that usually do not require medical attention (report to your doctor or health care professional if they continue or are bothersome):  changes in taste  diarrhea  hair loss  muscle or joint pain  stomach upset This list may not describe all possible side effects. Call your doctor for medical advice about side effects. You may report side effects to FDA at 1-800-FDA-1088. Where should I keep my medicine? Keep out of the reach of children. Store at room temperature between 15 and 30 degrees C (59 and 86 degrees F). Throw away any unused medicine after the expiration date. NOTE: This sheet is a summary. It may not cover all possible information. If you have questions about this medicine, talk to your doctor, pharmacist, or health care provider.  2020 Elsevier/Gold Standard (2018-12-12 15:35:11)  

## 2019-04-14 ENCOUNTER — Telehealth: Payer: Self-pay | Admitting: Gastroenterology

## 2019-04-14 NOTE — Telephone Encounter (Addendum)
04/14/2019 @ 9:46 am Left message on voicemail for pt to return call for covid screen.  Patient cancelled appt.

## 2019-04-15 ENCOUNTER — Ambulatory Visit: Payer: No Typology Code available for payment source | Admitting: Gastroenterology

## 2019-04-16 ENCOUNTER — Telehealth: Payer: Self-pay | Admitting: Podiatry

## 2019-04-16 NOTE — Telephone Encounter (Signed)
Pt unable to go get blood work this week due to work schedule. Wants to know if he will need new paperwork to go get blood work or can he keep the paperwork he currently has. Requested a call and can leave a voicemail or reply via MyChart.

## 2019-04-16 NOTE — Telephone Encounter (Signed)
I called the patient and the patient stated that he got his work schedule mixed up and will try and go Monday and I stated that the blood work request was in the epic system and if not to call me and I will fax over the request. Mark Wade

## 2019-05-28 LAB — CBC WITH DIFFERENTIAL/PLATELET
Absolute Monocytes: 371 cells/uL (ref 200–950)
Basophils Absolute: 41 cells/uL (ref 0–200)
Basophils Relative: 0.7 %
Eosinophils Absolute: 81 cells/uL (ref 15–500)
Eosinophils Relative: 1.4 %
HCT: 39.5 % (ref 38.5–50.0)
Hemoglobin: 13.7 g/dL (ref 13.2–17.1)
Lymphs Abs: 2268 cells/uL (ref 850–3900)
MCH: 30.9 pg (ref 27.0–33.0)
MCHC: 34.7 g/dL (ref 32.0–36.0)
MCV: 89 fL (ref 80.0–100.0)
MPV: 11.7 fL (ref 7.5–12.5)
Monocytes Relative: 6.4 %
Neutro Abs: 3039 cells/uL (ref 1500–7800)
Neutrophils Relative %: 52.4 %
Platelets: 211 10*3/uL (ref 140–400)
RBC: 4.44 10*6/uL (ref 4.20–5.80)
RDW: 13.6 % (ref 11.0–15.0)
Total Lymphocyte: 39.1 %
WBC: 5.8 10*3/uL (ref 3.8–10.8)

## 2019-05-28 LAB — HEPATIC FUNCTION PANEL
AG Ratio: 1.9 (calc) (ref 1.0–2.5)
ALT: 23 U/L (ref 9–46)
AST: 20 U/L (ref 10–40)
Albumin: 4.5 g/dL (ref 3.6–5.1)
Alkaline phosphatase (APISO): 46 U/L (ref 36–130)
Bilirubin, Direct: 0.2 mg/dL (ref 0.0–0.2)
Globulin: 2.4 g/dL (calc) (ref 1.9–3.7)
Indirect Bilirubin: 0.4 mg/dL (calc) (ref 0.2–1.2)
Total Bilirubin: 0.6 mg/dL (ref 0.2–1.2)
Total Protein: 6.9 g/dL (ref 6.1–8.1)

## 2019-05-29 ENCOUNTER — Telehealth: Payer: Self-pay | Admitting: *Deleted

## 2019-05-29 NOTE — Telephone Encounter (Signed)
-----   Message from Trula Slade, DPM sent at 05/28/2019  7:13 AM EDT ----- Blood work is normal. Can you see if he finished the 90 day course of medication or how far into it is he? Thanks.

## 2019-05-29 NOTE — Telephone Encounter (Signed)
Left message informing pt Dr. Jacqualyn Posey had reviewed blood work and he could take the medication to completion.

## 2019-06-11 ENCOUNTER — Other Ambulatory Visit: Payer: Self-pay

## 2019-06-11 ENCOUNTER — Ambulatory Visit (INDEPENDENT_AMBULATORY_CARE_PROVIDER_SITE_OTHER): Payer: 59 | Admitting: Podiatry

## 2019-06-11 DIAGNOSIS — B351 Tinea unguium: Secondary | ICD-10-CM

## 2019-06-11 DIAGNOSIS — B353 Tinea pedis: Secondary | ICD-10-CM | POA: Diagnosis not present

## 2019-06-11 DIAGNOSIS — Z79899 Other long term (current) drug therapy: Secondary | ICD-10-CM

## 2019-06-11 NOTE — Progress Notes (Signed)
Subjective: 23 year old male presents the office today for follow-up evaluation of athlete's foot, onychomycosis.  Still on Lamisil he does not take it every day as he forgets.  He denies any side effects.  Overall he is doing much better.Denies any systemic complaints such as fevers, chills, nausea, vomiting. No acute changes since last appointment, and no other complaints at this time.   Objective: AAO x3, NAD DP/PT pulses palpable bilaterally, CRT less than 3 seconds There is still be clear on the proximal nail border.  There is still hypertrophic, dystrophic toenails present with yellow-brown discoloration.  Tinea pedis is resolved.  No pain with calf compression, swelling, warmth, erythema  Assessment: Onychomycosis, currently on Lamisil with improvement  Plan: -All treatment options discussed with the patient including all alternatives, risks, complications.  -Continue Lamisil.  Continue to monitor for any side effects medication.  He is currently reporting none. -Patient encouraged to call the office with any questions, concerns, change in symptoms.   Trula Slade DPM

## 2019-06-26 MED FILL — TERBINAFINE HCL 250 MG TAB: 250 | 30 days supply | Qty: 30 | Fill #1

## 2019-06-26 MED FILL — OMEPRAZOLE 20 MG CAP: 20 | 30 days supply | Qty: 30 | Fill #1

## 2019-07-09 MED FILL — OMEPRAZOLE 20 MG CAP: 20 | 30 days supply | Qty: 30 | Fill #1

## 2019-07-09 MED FILL — TERBINAFINE HCL 250 MG TAB: 250 | 30 days supply | Qty: 30 | Fill #1

## 2019-09-03 ENCOUNTER — Encounter: Payer: No Typology Code available for payment source | Admitting: Family Medicine

## 2019-10-08 ENCOUNTER — Encounter: Payer: No Typology Code available for payment source | Admitting: Family Medicine

## 2019-11-12 ENCOUNTER — Encounter: Payer: No Typology Code available for payment source | Admitting: Family Medicine

## 2019-11-12 ENCOUNTER — Telehealth: Payer: Self-pay | Admitting: Family Medicine

## 2019-11-12 NOTE — Telephone Encounter (Signed)
error 

## 2019-12-14 ENCOUNTER — Ambulatory Visit: Payer: 59 | Admitting: Podiatry

## 2019-12-14 ENCOUNTER — Telehealth: Payer: Self-pay | Admitting: *Deleted

## 2019-12-14 ENCOUNTER — Encounter: Payer: Self-pay | Admitting: Podiatry

## 2019-12-14 ENCOUNTER — Other Ambulatory Visit: Payer: Self-pay

## 2019-12-14 VITALS — Temp 96.2°F

## 2019-12-14 DIAGNOSIS — B351 Tinea unguium: Secondary | ICD-10-CM | POA: Diagnosis not present

## 2019-12-14 DIAGNOSIS — M79675 Pain in left toe(s): Secondary | ICD-10-CM | POA: Diagnosis not present

## 2019-12-14 NOTE — Patient Instructions (Addendum)

## 2019-12-14 NOTE — Telephone Encounter (Signed)
Pt asked when he should begin the soaking.

## 2019-12-14 NOTE — Telephone Encounter (Signed)
I spoke with pt and informed he should begin the soaks tomorrow morning, but if he had a great deal of discomfort may begin tonight, but may have more bleeding than if started tomorrow.

## 2019-12-14 NOTE — Progress Notes (Signed)
Subjective: 24 year old male presents the office today requesting his left fourth toenail to be removed.  He was on Lamisil and all the other issues resolved however the toenails of the still thick and discolored causing irritation and causing discomfort.  No redness or drainage. Denies any systemic complaints such as fevers, chills, nausea, vomiting. No acute changes since last appointment, and no other complaints at this time.   Objective: AAO x3, NAD DP/PT pulses palpable bilaterally, CRT less than 3 seconds Left fourth digit toenail is hypertrophic, dystrophic with yellow-brown discoloration.  Tenderness palpation palpation of the nail.  No edema, erythema or signs of infection. No open lesions or pre-ulcerative lesions.  No pain with calf compression, swelling, warmth, erythema  Assessment: Left fourth digit symptomatic onychodystrophy/onychomycosis  Plan: -All treatment options discussed with the patient including all alternatives, risks, complications.  -Discussed no removal as well as permanent removal.  We will hold off on partial nail removal and hope for the nail grow back in better.  Discussed the risks of this and is not a guarantee for the nail come back abnormal.- -At this time, discussed total nail removal without chemical matricectomy to the left 4th digit due to thickening/pain. Risks and complications were discussed with the patient for which they understand and  verbally consent to the procedure. Under sterile conditions a total of 3 mL of a mixture of 2% lidocaine plain and 0.5% Marcaine plain was infiltrated in a hallux digital fashion. Once anesthetized, the skin was prepped in sterile fashion. A tourniquet was then applied. Next the left 4th digit nail was excised making sure to remove the entire offending nail border. Once the nail was removed, the area was debrided and the underlying skin was intact. The area was irrigated and hemostasis was obtained.  A dry sterile dressing  was applied. After application of the dressing the tourniquet was removed and there is found to be an immediate capillary refill time to the digit. The patient tolerated the procedure well any complications. Post procedure instructions were discussed the patient for which he verbally understood. Follow-up in one week for nail check or sooner if any problems are to arise. Discussed signs/symptoms of worsening infection and directed to call the office immediately should any occur or go directly to the emergency room. In the meantime, encouraged to call the office with any questions, concerns, changes symptoms. -Patient encouraged to call the office with any questions, concerns, change in symptoms.   Return for nail check 4th toe left , nail check.  Vivi Barrack DPM

## 2019-12-16 ENCOUNTER — Other Ambulatory Visit: Payer: Self-pay

## 2019-12-17 ENCOUNTER — Ambulatory Visit (INDEPENDENT_AMBULATORY_CARE_PROVIDER_SITE_OTHER): Payer: 59 | Admitting: Family Medicine

## 2019-12-17 ENCOUNTER — Encounter: Payer: Self-pay | Admitting: Family Medicine

## 2019-12-17 VITALS — BP 124/78 | HR 75 | Temp 98.0°F | Ht 74.0 in | Wt 230.8 lb

## 2019-12-17 DIAGNOSIS — M25362 Other instability, left knee: Secondary | ICD-10-CM

## 2019-12-17 DIAGNOSIS — R1084 Generalized abdominal pain: Secondary | ICD-10-CM

## 2019-12-17 DIAGNOSIS — M25361 Other instability, right knee: Secondary | ICD-10-CM

## 2019-12-17 DIAGNOSIS — Z0000 Encounter for general adult medical examination without abnormal findings: Secondary | ICD-10-CM

## 2019-12-17 DIAGNOSIS — Z13220 Encounter for screening for lipoid disorders: Secondary | ICD-10-CM

## 2019-12-17 DIAGNOSIS — Z113 Encounter for screening for infections with a predominantly sexual mode of transmission: Secondary | ICD-10-CM

## 2019-12-17 DIAGNOSIS — K219 Gastro-esophageal reflux disease without esophagitis: Secondary | ICD-10-CM

## 2019-12-17 MED ORDER — OMEPRAZOLE 20 MG PO CPDR: 20.0000 mg | DELAYED_RELEASE_CAPSULE | Freq: Every day | ORAL | 3 refills | Status: DC

## 2019-12-17 NOTE — Progress Notes (Signed)
Subjective:     Mark Wade is a 24 y.o. male and is here for a comprehensive physical exam. The patient reports problems - knee discomfort, GERD.  Pt seen by podiatry.  Treated for chronic athelete's foot and had L 4th toenail removed.  Pt notes b/l knee discomfort and feeling knee might give out at times.  Denies injury, edema, erythema, hearing any popping, clicking, or tearing.  Pt notes return of acid reflux symptoms.  States symptoms were controlled on prilosec, would like to restart med.  Unable to [place] which foods cause problems.  Interested in [place] testing.  Social History   Socioeconomic History  . Marital status: Single    Spouse name: Not on file  . Number of children: Not on file  . Years of education: Not on file  . Highest education level: Not on file  Occupational History  . Not on file  Tobacco Use  . Smoking status: Never Smoker  . Smokeless tobacco: Never Used  Substance and Sexual Activity  . Alcohol use: No  . Drug use: Yes    Types: Marijuana  . Sexual activity: Yes  Other Topics Concern  . Not on file  Social History Narrative  . Not on file   Social Determinants of Health   Financial Resource Strain:   . Difficulty of Paying Living Expenses:   Food Insecurity:   . Worried About Charity fundraiser in the Last Year:   . Arboriculturist in the Last Year:   Transportation Needs:   . Film/video editor (Medical):   Marland Kitchen Lack of Transportation (Non-Medical):   Physical Activity:   . Days of Exercise per Week:   . Minutes of Exercise per Session:   Stress:   . Feeling of Stress :   Social Connections:   . Frequency of Communication with Friends and Family:   . Frequency of Social Gatherings with Friends and Family:   . Attends Religious Services:   . Active Member of Clubs or Organizations:   . Attends Archivist Meetings:   Marland Kitchen Marital Status:   Intimate Partner Violence:   . Fear of Current or Ex-Partner:   . Emotionally Abused:   Marland Kitchen  Physically Abused:   . Sexually Abused:    Health Maintenance  Topic Date Due  . HIV Screening  Never done  . INFLUENZA VACCINE  04/17/2020  . TETANUS/TDAP  10/02/2022    The following portions of the patient's history were reviewed and updated as appropriate: allergies, current medications, past family history, past medical history, past social history, past surgical history and problem list.  Review of Systems Pertinent items noted in HPI and remainder of comprehensive ROS otherwise negative.   Objective:    BP 124/78 (BP Location: Right Arm, Patient Position: Sitting, Cuff Size: Normal)   Pulse 75   Temp 98 F (36.7 C) (Temporal)   Ht 6\' 2"  (1.88 m)   Wt 230 lb 12.8 oz (104.7 kg)   SpO2 97%   BMI 29.63 kg/m  General appearance: alert, cooperative and no distress Head: Normocephalic, without obvious abnormality, atraumatic Eyes: conjunctivae/corneas clear. PERRL, EOM's intact. Fundi benign. Ears: normal TM's and external ear canals both ears Nose: Nares normal. Septum midline. Mucosa normal. No drainage or sinus tenderness. Throat: lips, mucosa, and tongue normal; teeth and gums normal Neck: no adenopathy, no carotid bruit, no JVD, supple, symmetrical, trachea midline and thyroid not enlarged, symmetric, no tenderness/mass/nodules Lungs: clear to auscultation bilaterally  Heart: regular rate and rhythm, S1, S2 normal, no murmur, click, rub or gallop Abdomen: BS present, soft, non distended.  Mild TTP throughout.  No hepatosplenomegaly. Extremities: extremities normal, atraumatic, no cyanosis or edema.  No deformities of b/l LEs.  No crepitus, erythema, edema, or joint line TTP of b/l knees.  Patellar tendon laxity noted b/l L>R.  Negative Lachman's, Anterior Drawer. Pulses: 2+ and symmetric Skin: Skin color, texture, turgor normal. No rashes or lesions  L foot 4th digit with bandaid in place, no erythema or strike through noted.  Skin of feet normal in appearance without rash,  lesions, desquamination, or erythema. Lymph nodes: Cervical, supraclavicular, and axillary nodes normal. Neurologic: Alert and oriented X 3, normal strength and tone. Normal symmetric reflexes. Normal coordination and gait    Assessment:    Healthy male exam with b/l patellar laxity, GERD     Plan:     Anticipatory guidance given including wearing seatbelts, smoke detectors in the home, increasing physical activity, increasing p.o. intake of water and vegetables. -will obtain labs including STI testing -given handout -next CPE in 1 yr See After Visit Summary for Counseling Recommendations    Patellar instability of both knees -discussed strengthening quadricep muscles b/l. -given handouts and exercises -discussed PT.  Pt declines at this time. -for continued symptoms PT and sports med   Gastroesophageal reflux disease, unspecified whether esophagitis present  -keep a food diary.  Avoid foods known to cause problems -consider f/u with GI for continued issues. - Plan: omeprazole (PRILOSEC) 20 MG capsule   Routine screening for STI (sexually transmitted infection)  - Plan: RPR, HIV antibody (with reflex), Chlamydia/Neisseria Gonorrhoeae RNA,TMA,Urogenital, Chlamydia/Neisseria Gonorrhoeae RNA,TMA,Urogenital, HIV antibody (with reflex), RPR  Generalized abdominal pain  -discussed possible causes including GERD, constipation, H. pylori/gastric ulcer  - Plan: Comprehensive metabolic panel, Hemoglobin A1c, Lipid panel, Chlamydia/Neisseria Gonorrhoeae RNA,TMA,Urogenital, Lipid panel, Hemoglobin A1c, Comprehensive metabolic pane  Screening for cholesterol level  - Plan: Lipid panel, Lipid panel  F/u prn  Abbe Amsterdam, MD

## 2019-12-17 NOTE — Patient Instructions (Addendum)
Preventive Care 19-26 Years Old, Male Preventive care refers to lifestyle choices and visits with your health care provider that can promote health and wellness. This includes:  A yearly physical exam. This is also called an annual well check.  Regular dental and eye exams.  Immunizations.  Screening for certain conditions.  Healthy lifestyle choices, such as eating a healthy diet, getting regular exercise, not using drugs or products that contain nicotine and tobacco, and limiting alcohol use. What can I expect for my preventive care visit? Physical exam Your health care provider will check:  Height and weight. These may be used to calculate body mass index (BMI), which is a measurement that tells if you are at a healthy weight.  Heart rate and blood pressure.  Your skin for abnormal spots. Counseling Your health care provider may ask you questions about:  Alcohol, tobacco, and drug use.  Emotional well-being.  Home and relationship well-being.  Sexual activity.  Eating habits.  Work and work Statistician. What immunizations do I need?  Influenza (flu) vaccine  This is recommended every year. Tetanus, diphtheria, and pertussis (Tdap) vaccine  You may need a Td booster every 10 years. Varicella (chickenpox) vaccine  You may need this vaccine if you have not already been vaccinated. Human papillomavirus (HPV) vaccine  If recommended by your health care provider, you may need three doses over 6 months. Measles, mumps, and rubella (MMR) vaccine  You may need at least one dose of MMR. You may also need a second dose. Meningococcal conjugate (MenACWY) vaccine  One dose is recommended if you are 45-76 years old and a Market researcher living in a residence hall, or if you have one of several medical conditions. You may also need additional booster doses. Pneumococcal conjugate (PCV13) vaccine  You may need this if you have certain conditions and were not  previously vaccinated. Pneumococcal polysaccharide (PPSV23) vaccine  You may need one or two doses if you smoke cigarettes or if you have certain conditions. Hepatitis A vaccine  You may need this if you have certain conditions or if you travel or work in places where you may be exposed to hepatitis A. Hepatitis B vaccine  You may need this if you have certain conditions or if you travel or work in places where you may be exposed to hepatitis B. Haemophilus influenzae type b (Hib) vaccine  You may need this if you have certain risk factors. You may receive vaccines as individual doses or as more than one vaccine together in one shot (combination vaccines). Talk with your health care provider about the risks and benefits of combination vaccines. What tests do I need? Blood tests  Lipid and cholesterol levels. These may be checked every 5 years starting at age 17.  Hepatitis C test.  Hepatitis B test. Screening   Diabetes screening. This is done by checking your blood sugar (glucose) after you have not eaten for a while (fasting).  Sexually transmitted disease (STD) testing. Talk with your health care provider about your test results, treatment options, and if necessary, the need for more tests. Follow these instructions at home: Eating and drinking   Eat a diet that includes fresh fruits and vegetables, whole grains, lean protein, and low-fat dairy products.  Take vitamin and mineral supplements as recommended by your health care provider.  Do not drink alcohol if your health care provider tells you not to drink.  If you drink alcohol: ? Limit how much you have to 0-2  drinks a day. ? Be aware of how much alcohol is in your drink. In the [place] one drink equals one 12 oz bottle of beer (355 mL), one 5 oz glass of wine (148 mL), or one 1 oz glass of hard liquor (44 mL). Lifestyle  Take daily care of your teeth and gums.  Stay active. Exercise for at least 30 minutes on 5 or  more days each week.  Do not use any products that contain nicotine or tobacco, such as cigarettes, e-cigarettes, and chewing tobacco. If you need help quitting, ask your health care provider.  If you are sexually active, practice safe sex. Use a condom or other form of protection to prevent STIs (sexually transmitted infections). What's next?  Go to your health care provider once a year for a well check visit.  Ask your health care provider how often you should have your eyes and teeth checked.  Stay up to date on all vaccines. This information is not intended to replace advice given to you by your health care provider. Make sure you discuss any questions you have with your health care provider. Document Revised: 08/28/2018 Document Reviewed: 08/28/2018 Elsevier Patient Education  2020 [employer] Inc[employer]  Food Choices for Gastroesophageal Reflux Disease, Adult When you have gastroesophageal reflux disease (GERD), the foods you eat and your eating habits are very important. Choosing the right foods can help ease the discomfort of GERD. Consider working with a diet and nutrition specialist (dietitian) to help you make healthy food choices. What general guidelines should I follow?  Eating plan  Choose healthy foods low in fat, such as fruits, vegetables, whole grains, low-fat dairy products, and lean meat, fish, and poultry.  Eat frequent, small meals instead of three large meals each day. Eat your meals slowly, in a relaxed setting. Avoid bending over or lying down until 2-3 hours after eating.  Limit high-fat foods such as fatty meats or fried foods.  Limit your intake of oils, butter, and shortening to less than 8 teaspoons each day.  Avoid the following: ? Foods that cause symptoms. These may be different for different people. Keep a food diary to keep track of foods that cause symptoms. ? Alcohol. ? Drinking large amounts of liquid with meals. ? Eating meals during the 2-3 hours  before bed.  Cook foods using methods other than frying. This may include baking, grilling, or broiling. Lifestyle  Maintain a healthy weight. Ask your health care provider what weight is healthy for you. If you need to lose weight, work with your health care provider to do so safely.  Exercise for at least 30 minutes on 5 or more days each week, or as told by your health care provider.  Avoid wearing clothes that fit tightly around your waist and chest.  Do not use any products that contain nicotine or tobacco, such as cigarettes and e-cigarettes. If you need help quitting, ask your health care provider.  Sleep with the head of your bed raised. Use a wedge under the mattress or blocks under the bed frame to raise the head of the bed. What foods are not recommended? The items listed may not be a complete list. Talk with your dietitian about what dietary choices are best for you. Grains Pastries or quick breads with added fat. French toast. Vegetables Deep fried vegetables. French fries. Any vegetables prepared with added fat. Any vegetables that cause symptoms. For some people this may include tomatoes and tomato products, chili peppers, onions and   garlic, and horseradish. Fruits Any fruits prepared with added fat. Any fruits that cause symptoms. For some people this may include citrus fruits, such as oranges, grapefruit, pineapple, and lemons. Meats and other protein foods High-fat meats, such as fatty beef or pork, hot dogs, ribs, ham, sausage, salami and bacon. Fried meat or protein, including fried fish and fried chicken. Nuts and nut butters. Dairy Whole milk and chocolate milk. Sour cream. Cream. Ice cream. Cream cheese. Milk shakes. Beverages Coffee and tea, with or without caffeine. Carbonated beverages. Sodas. Energy drinks. Fruit juice made with acidic fruits (such as orange or grapefruit). Tomato juice. Alcoholic drinks. Fats and oils Butter. Margarine. Shortening.  Ghee. Sweets and desserts Chocolate and cocoa. Donuts. Seasoning and other foods Pepper. Peppermint and spearmint. Any condiments, herbs, or seasonings that cause symptoms. For some people, this may include curry, hot sauce, or vinegar-based salad dressings. Summary  When you have gastroesophageal reflux disease (GERD), food and lifestyle choices are very important to help ease the discomfort of GERD.  Eat frequent, small meals instead of three large meals each day. Eat your meals slowly, in a relaxed setting. Avoid bending over or lying down until 2-3 hours after eating.  Limit high-fat foods such as fatty meat or fried foods. This information is not intended to replace advice given to you by your health care provider. Make sure you discuss any questions you have with your health care provider. Document Revised: 12/25/2018 Document Reviewed: 09/04/2016 Elsevier Patient Education  2020 [employer] Inc.  Quadriceps Tendon Tear Rehab Ask your health care provider which exercises are safe for you. Do exercises exactly as told by your health care provider and adjust them as directed. It is normal to feel mild stretching, pulling, tightness, or discomfort as you do these exercises. Stop right away if you feel sudden pain or your pain gets worse. Do not begin these exercises until told by your health care provider. Stretching and range-of-motion exercises These exercises warm up your muscles and joints and improve the movement and flexibility of your thigh. These exercises can also help to relieve pain, numbness, and tingling. Knee flexion, active This is an exercise in which you use your effort to move your knee joint (active flexion). 1. Lie on your back with both legs straight. If this causes back discomfort, bend your healthy knee so your foot is flat on the floor. 2. Slowly slide your left / right heel back toward your buttocks (flexion). Stop when you feel a gentle stretch in the front of your  knee or thigh (quadriceps). 3. Hold this position for __________ seconds. 4. Slowly slide your left / right heel back to the starting position. Repeat __________ times. Complete this exercise __________ times a day. Knee extension, passive This is an exercise in which your knee joint is straightened with no effort on your part (passive extension). 1. Sit with your left / right heel propped on a chair, a coffee table, or a footstool. Do not have anything under your knee to support it. 2. Allow your leg muscles to relax, letting gravity straighten out your knee. You should feel a stretch behind your left / right knee. 3. If told by your health care provider, deepen the stretch by placing a __________ lb weight on your thigh, just above your kneecap. 4. Hold this position for __________ seconds. Repeat __________ times. Complete this exercise __________ times a day. Strengthening exercises These exercises build strength and endurance in your thigh. Endurance is the ability to   use your muscles for a long time, even after your muscles get tired. Do these exercises while wearing your brace if told by your health care provider. Quadriceps, isometric This is an exercise in which you stretch the muscles in front of your thigh (quadriceps) without moving your knee joint (isometric). 1. Lie on your back with your left / right leg extended and your other knee bent. If told by your health care provider, put a small pillow or rolled towel under your extended knee. 2. Slowly tense the muscles in the front of your left / right thigh. You should see your kneecap slide up toward your hip or see increased dimpling just above the knee. This motion will push the back of your knee down toward the floor. 3. For __________ seconds, hold the muscle as tight as you can without increasing your pain. 4. Relax the muscles slowly and completely after each repetition. Repeat __________ times. Complete this exercise __________  times a day. Straight leg raises, supine This exercise stretches the muscles in front of your thigh (quadriceps) and the muscles that move your hips (hip flexors). 1. Lie on your back (supine position) with your left / right leg extended and your other knee bent. 2. Tense the muscles in the front of your left / right thigh. You should see your kneecap slide up or see increased dimpling just above the knee. 3. Keep these muscles tight while you raise your leg 4-6 inches (10-15 cm) off the floor. Do not let your knee bend. 4. Hold this position for __________ seconds. 5. Keep the muscles tense and keep your knee straight as you lower your leg. 6. Relax the muscles slowly and completely after each repetition. Repeat __________ times. Complete this exercise __________ times a day. Wall slides This exercise is sometimes called knee and hip extensor exercise. 1. Lean your back against a smooth wall or door, and walk your feet out 18-24 inches (46-61 cm) from it. 2. Place your feet hip-width apart. 3. Slowly slide down the wall or door until your knees bend __________ degrees. ? Do not bend your knees farther than told by your health care provider. ? Keep your knees over your heels, not over your toes. ? Keep your knees in line with your hips. 4. Hold this position for __________ seconds. 5. Stand up to rest for __________ seconds after each repetition. Repeat __________ times. Complete this exercise __________ times a day. This information is not intended to replace advice given to you by your health care provider. Make sure you discuss any questions you have with your health care provider. Document Revised: 12/26/2018 Document Reviewed: 06/26/2018 Elsevier Patient Education  2020 [employer] Inc[employer]  Patellar Dislocation and Subluxation The kneecap (patella) is located in a groove at the end of the thigh bone (femur). Patellar dislocation and patellar subluxation are injuries that happen when the  patella slips out of its normal position.  In a patellar subluxation, the patella slips partly out of the groove.  In a patellar dislocation, the patella slips all the way out of the groove. What are the causes? This condition may be caused by:  A hit to the knee.  Twisting the knee when the foot is planted. What increases the risk? You are more likely to develop this condition if you:  Are an athlete in your teens or 20s.  Have had this condition before.  Play certain kinds of sports, including sports that: ? Include quick turns, quick changes in direction,   or contact with other players, like soccer. ? Require jumping, such as basketball or volleyball. ? Require that cleats are worn. What are the signs or symptoms? Symptoms of this condition include:  A feeling that the knee is buckling, followed by sudden extreme pain in the knee.  A deformed knee.  A popping sensation, followed by a feeling that something is out of place.  Inability to bend or straighten the knee.  Swelling in the knee. How is this diagnosed? This condition may be diagnosed with:  A physical exam.  An X-ray. This may be done to see the position of the patella or to see if a bone has broken.  An MRI. This may be done to look at the alignment of your knee and the ligaments that hold your patella in place. How is this treated? Your patella may move back into place on its own when you straighten your knee. If your patella does not move back into place on its own, your health care provider will move it back into place. After your patella is back in its normal position, you may be able to go back to your normal activities, or you may be treated further by:  Wearing a knee brace to keep your knee from moving (immobilized) while it heals.  Doing exercises that help improve strength and movement in your knee.  Taking medicine to help with pain and inflammation.  Applying ice to the knee to help with pain  and inflammation.  Having surgery to prevent the patella from slipping out of place or to clean out any loose cartilage in your joint. This may be needed if other treatments do not help or if the condition keeps happening. Follow these instructions at home: If you have a brace:  Wear the brace as told by your health care provider. Remove it only as told by your health care provider.  Loosen the brace if your toes tingle, become numb, or turn cold and blue.  Keep the brace clean.  If the brace is not waterproof: ? Do not let it get wet. ? Cover it with a watertight covering when you take a bath or shower. Managing pain, stiffness, and swelling   If directed, put ice on the affected area. ? If you have a removable brace, remove it as told by your health care provider. ? Put ice in a plastic bag. ? Place a towel between your skin and the bag. ? Leave the ice on for 20 minutes, 2-3 times a day.  Move your toes often to reduce stiffness and swelling.  Raise (elevate) the injured area above the level of your heart while you are sitting or lying down. Activity  Do not use the injured limb to support your body weight until your health care provider says that you can. Use crutches as told by your health care provider.  Return to your normal activities as told by your health care provider. Ask your health care provider what activities are safe for you.  Do exercises as told by your health care provider. General instructions  Take over-the-counter and prescription medicines only as told by your health care provider.  Do not use any products that contain nicotine or tobacco, such as cigarettes, e-cigarettes, and chewing tobacco. These can delay healing. If you need help quitting, ask your health care provider.  Keep all follow-up visits as told by your health care provider. This is important. How is this prevented?  Warm up and stretch before   being active.  Cool down and stretch  after being active.  Give your body time to rest between periods of activity.  Make sure to use equipment that fits you.  Be safe and responsible while being active. This will help you avoid falls.  Do at least 150 minutes of moderate-intensity exercise each week, such as brisk walking or water aerobics.  Maintain physical fitness, including: ? Strength. ? Flexibility. ? Cardiovascular fitness. ? Endurance. Contact a health care provider if:  The pain in your knee gets worse and is not relieved by medicine.  Your knee catches or locks. Get help right away if:  Your patella slips out of its normal position again.  The swelling in your knee gets worse. Summary  Patellar dislocation and patellar subluxation are injuries that happen when the patella slips out of its normal position.  If your patella does not move back into place on its own, your health care provider will move it back into place.  Return to your normal activities as told by your health care provider. Ask your health care provider what activities are safe for you.  Do not use the injured limb to support your body weight until your health care provider says that you can. Use crutches as told by your health care provider.  Keep all follow-up visits as told by your health care provider. This is important. This information is not intended to replace advice given to you by your health care provider. Make sure you discuss any questions you have with your health care provider. Document Revised: 12/30/2018 Document Reviewed: 07/28/2018 Elsevier Patient Education  [place].

## 2019-12-19 LAB — RPR: RPR Ser Ql: NONREACTIVE

## 2019-12-19 LAB — CBC
HCT: 41.8 % (ref 38.5–50.0)
Hemoglobin: 14.3 g/dL (ref 13.2–17.1)
MCH: 31 pg (ref 27.0–33.0)
MCHC: 34.2 g/dL (ref 32.0–36.0)
MCV: 90.7 fL (ref 80.0–100.0)
MPV: 11.2 fL (ref 7.5–12.5)
Platelets: 182 10*3/uL (ref 140–400)
RBC: 4.61 10*6/uL (ref 4.20–5.80)
RDW: 13.1 % (ref 11.0–15.0)
WBC: 6.9 10*3/uL (ref 3.8–10.8)

## 2019-12-19 LAB — COMPREHENSIVE METABOLIC PANEL
AG Ratio: 1.8 (calc) (ref 1.0–2.5)
ALT: 23 U/L (ref 9–46)
AST: 20 U/L (ref 10–40)
Albumin: 4.4 g/dL (ref 3.6–5.1)
Alkaline phosphatase (APISO): 46 U/L (ref 36–130)
BUN: 13 mg/dL (ref 7–25)
CO2: 30 mmol/L (ref 20–32)
Calcium: 9.8 mg/dL (ref 8.6–10.3)
Chloride: 107 mmol/L (ref 98–110)
Creat: 0.84 mg/dL (ref 0.60–1.35)
Globulin: 2.4 g/dL (calc) (ref 1.9–3.7)
Glucose, Bld: 85 mg/dL (ref 65–99)
Potassium: 3.9 mmol/L (ref 3.5–5.3)
Sodium: 141 mmol/L (ref 135–146)
Total Bilirubin: 0.7 mg/dL (ref 0.2–1.2)
Total Protein: 6.8 g/dL (ref 6.1–8.1)

## 2019-12-19 LAB — LIPID PANEL
Cholesterol: 141 mg/dL (ref <200)
HDL: 43 mg/dL (ref >=40)
LDL Cholesterol (Calc): 85 mg/dL (calc)
Non-HDL Cholesterol (Calc): 98 mg/dL (calc) (ref <130)
Total CHOL/HDL Ratio: 3.3 (calc) (ref <5.0)
Triglycerides: 45 mg/dL (ref <150)

## 2019-12-19 LAB — CHLAMYDIA/NEISSERIA GONORRHOEAE RNA,TMA,UROGENTIAL
C. trachomatis RNA, TMA: NOT DETECTED
N. gonorrhoeae RNA, TMA: NOT DETECTED

## 2019-12-19 LAB — HEMOGLOBIN A1C
Hgb A1c MFr Bld: 5.1 % of total Hgb (ref <5.7)
Mean Plasma Glucose: 100 (calc)
eAG (mmol/L): 5.5 (calc)

## 2019-12-19 LAB — HIV ANTIBODY (ROUTINE TESTING W REFLEX): HIV 1&2 Ab, 4th Generation: NONREACTIVE

## 2019-12-21 ENCOUNTER — Encounter: Payer: Self-pay | Admitting: Family Medicine

## 2019-12-29 ENCOUNTER — Ambulatory Visit (INDEPENDENT_AMBULATORY_CARE_PROVIDER_SITE_OTHER): Payer: 59 | Admitting: Podiatry

## 2019-12-29 ENCOUNTER — Other Ambulatory Visit: Payer: Self-pay

## 2019-12-29 DIAGNOSIS — B351 Tinea unguium: Secondary | ICD-10-CM

## 2019-12-29 DIAGNOSIS — M79675 Pain in left toe(s): Secondary | ICD-10-CM

## 2019-12-30 NOTE — Progress Notes (Signed)
Patient to do a virtual visit for follow-up after having the right fourth toenail removed.  We did a FaceTime call.  He was currently driving when I called him so the visit was quick.  He states that he was doing well and is having no pain, swelling or drainage.  He has stopped soaking as he is doing well.  Advised him to continue keeping area covered antibiotic ointment and a bandage in the day and wash with soap and water.  Monitoring signs or symptoms of recurrence or any reoccurrence to call the office but otherwise he is doing well and I will see him back as needed.  No charge for today's visit as this was a quick visit mostly telephone encounter.

## 2020-02-02 ENCOUNTER — Telehealth: Payer: Self-pay | Admitting: Family Medicine

## 2020-02-02 NOTE — Telephone Encounter (Signed)
Ok for referral to Ortho for patellar instability.

## 2020-02-02 NOTE — Telephone Encounter (Signed)
Ok for referral?

## 2020-02-02 NOTE — Telephone Encounter (Signed)
The patient is having knee problems and needs to see orthopedics. The patient wants to know if he needs to make an appointment with Dr. Salomon Fick since he just saw her 12/17/2019 or can she just sent a referral to orthopedics.  Please advise

## 2020-02-03 ENCOUNTER — Other Ambulatory Visit: Payer: Self-pay

## 2020-02-03 DIAGNOSIS — M25361 Other instability, right knee: Secondary | ICD-10-CM

## 2020-02-03 DIAGNOSIS — M25362 Other instability, left knee: Secondary | ICD-10-CM

## 2020-02-03 NOTE — Telephone Encounter (Signed)
Referral to Orthopedics placed, pt notified on MyChart portal

## 2020-02-08 ENCOUNTER — Ambulatory Visit: Payer: 59

## 2020-02-08 ENCOUNTER — Other Ambulatory Visit: Payer: Self-pay

## 2020-02-08 ENCOUNTER — Ambulatory Visit (INDEPENDENT_AMBULATORY_CARE_PROVIDER_SITE_OTHER): Payer: 59 | Admitting: Physician Assistant

## 2020-02-08 ENCOUNTER — Encounter: Payer: Self-pay | Admitting: Physician Assistant

## 2020-02-08 ENCOUNTER — Ambulatory Visit: Payer: Self-pay

## 2020-02-08 DIAGNOSIS — M2241 Chondromalacia patellae, right knee: Secondary | ICD-10-CM

## 2020-02-08 DIAGNOSIS — M2242 Chondromalacia patellae, left knee: Secondary | ICD-10-CM

## 2020-02-08 NOTE — Progress Notes (Signed)
Office Visit Note   Patient: Mark Wade           Date of Birth: June 30, 1996           MRN: [id number] Visit Date: 02/08/2020              Requested by: Billie Ruddy, MD [place],  [place] [address] PCP: Billie Ruddy, MD   Assessment & Plan: Visit Diagnoses:  1. Chondromalacia of both patellae     Plan: We will have him apply Voltaren gel up to 4 g 4 times daily to both knees.  He will work on quad strengthening particularly VMO strengthening with physical therapy.  Prescriptions given for VMO strengthening, home exercise program and modalities.  He will follow up with [place] in approximately 6 weeks to see what type of response he had a conservative treatment.  Is unable to take NSAIDs due to irritable bowel syndrome.  Follow-Up Instructions: Return in about 6 weeks (around 03/21/2020).   Orders:  Orders Placed This Encounter  Procedures  . XR Knee 1-2 Views Right  . XR Knee 1-2 Views Left   No orders of the defined types were placed in this encounter.     Procedures: No procedures performed   Clinical Data: No additional findings.   Subjective: Chief Complaint  Patient presents with  . Right Knee - Pain  . Left Knee - Pain    HPI Mark Wade is a pleasant 24 year old male comes in today with bilateral knee pain referred by his primary care physician.  States he has pain in both knees when sitting for long period of time.  Feels that the right knee tunnel locks up on him and pops.  He does not describe dislocation of either knee.  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  He has tried no physical therapy, braces or medications.  He has tried some home exercises as suggested by his primary care physician.  No injury to either knee no previous surgery to either knee.  Does have pain with walking at times.  Review of Systems Please see HPI otherwise negative  Objective: Vital Signs: There were no vitals taken  for this visit.  Physical Exam Constitutional:      Appearance: He is not ill-appearing or diaphoretic.  Pulmonary:     Effort: Pulmonary effort is normal.  Neurological:     Mental Status: He is alert and oriented to person, place, and time.  Psychiatric:        Mood and Affect: Mood normal.     Ortho Exam Bilateral knees Tello femoral crepitus with passive range of motion both knees.  Good range of motion both knees actively.  No instability valgus varus stressing of either knee.  No tenderness along medial lateral joint line of either knee.  Tenderness peripatellar region both knees.  McMurray's is negative bilaterally.  Bilateral knees VMO atrophy Specialty Comments:  No specialty comments available.  Imaging: XR Knee 1-2 Views Left  Result Date: 02/08/2020 Left knee 2 views: No acute fractures.  Knee is well located.  No bony abnormalities.  No effusion or soft tissue swelling.  XR Knee 1-2 Views Right  Result Date: 02/08/2020 Right knee 2 views: No acute fractures.  No bony abnormalities.  No joint effusion or soft tissue swelling.  Knee is well located.    PMFS History: Patient Active Problem List   Diagnosis Date Noted  .  Onychomycosis 04/09/2019  . Nicotine dependence, cigarettes, uncomplicated 03/06/2017  . Athlete's foot 08/10/2015  . Vitamin D deficiency 08/10/2015   Past Medical History:  Diagnosis Date  . Asthma   . Frequent headaches   . Irritable bowel syndrome (IBS)   . Migraine     History reviewed. No pertinent family history.  Past Surgical History:  Procedure Laterality Date  . ESOPHAGOGASTRODUODENOSCOPY ENDOSCOPY     Social History   Occupational History  . Not on file  Tobacco Use  . Smoking status: Never Smoker  . Smokeless tobacco: Never Used  Substance and Sexual Activity  . Alcohol use: No  . Drug use: Yes    Types: Marijuana  . Sexual activity: Yes

## 2020-02-18 ENCOUNTER — Telehealth: Payer: Self-pay | Admitting: Family Medicine

## 2020-02-18 NOTE — Telephone Encounter (Signed)
Pt dropped off physical form to be completed. Placed in Dr. Salomon Fick folder.

## 2020-02-19 NOTE — Telephone Encounter (Signed)
Pt is calling in stating that he was told that he could pick the form up today that he had dropped off on yesterday 02/18/2020.  Pt is aware that we ask for 3-5 business day to complete a form.  Pt state that he needs the form back today and would like to have a call to let him know when the form is ready.

## 2020-02-19 NOTE — Telephone Encounter (Signed)
Pt form has been received and placed on Dr Salomon Fick desk for review, pt will be notified when the form is complete

## 2020-02-25 NOTE — Telephone Encounter (Signed)
Attempted to call pt to notify him that his form is ready for pick up,no option to leave message voicemail is not set up. Sent a MyChart message for pt to pick up form in the office.

## 2020-03-23 ENCOUNTER — Ambulatory Visit: Payer: 59 | Admitting: Physician Assistant

## 2020-05-30 ENCOUNTER — Telehealth: Payer: Self-pay | Admitting: Podiatry

## 2020-05-30 NOTE — Telephone Encounter (Signed)
Pt wanted an ant-itch cream for his foot. Preferred pharmacy is [facility] on [address].

## 2020-05-31 ENCOUNTER — Other Ambulatory Visit: Payer: Self-pay | Admitting: Podiatry

## 2020-05-31 MED ORDER — CLOTRIMAZOLE-BETAMETHASONE 1-0.05 % EX CREA: 1.0000 "application " | TOPICAL_CREAM | Freq: Two times a day (BID) | CUTANEOUS | 0 refills | Status: DC

## 2020-05-31 NOTE — Telephone Encounter (Signed)
sent 

## 2020-06-03 ENCOUNTER — Other Ambulatory Visit: Payer: Self-pay | Admitting: Podiatry

## 2020-06-03 ENCOUNTER — Telehealth: Payer: Self-pay | Admitting: Podiatry

## 2020-06-03 MED ORDER — CLOTRIMAZOLE-BETAMETHASONE 1-0.05 % EX CREA: 1.0000 "application " | TOPICAL_CREAM | Freq: Two times a day (BID) | CUTANEOUS | 0 refills | Status: DC

## 2020-06-03 NOTE — Telephone Encounter (Signed)
Pt called saying the clotrimazole-betamethasone cream prescribed on 9/14 is not at [id number]. I told him it shows they received it on 9/14 via E-scribe at 12:09 pm. Pt states he's called the pharmacy and they told him they do not have the Rx.

## 2020-06-03 NOTE — Telephone Encounter (Signed)
Called pt and let him know that Dr. Ardelle Anton had resent his Rx and it shows they received it at 2:26 pm.

## 2020-06-03 NOTE — Telephone Encounter (Signed)
I have resent it again

## 2020-06-15 ENCOUNTER — Telehealth: Payer: Self-pay | Admitting: Family Medicine

## 2020-06-15 NOTE — Telephone Encounter (Signed)
Pt tested positive for Covid pt wants to know what to do next; how long should he be in isolation?   How long should he wait to be tested again?

## 2020-06-16 NOTE — Telephone Encounter (Signed)
Spoke with pt offered to have him schedule appointment with Dr Salomon Fick, pt state that he will call next week to schedule a f/u for COVID -19 clearance back to work

## 2020-06-30 ENCOUNTER — Other Ambulatory Visit: Payer: Self-pay

## 2020-06-30 ENCOUNTER — Ambulatory Visit: Payer: 59 | Admitting: Family Medicine

## 2020-07-01 ENCOUNTER — Encounter: Payer: Self-pay | Admitting: Family Medicine

## 2020-07-01 ENCOUNTER — Ambulatory Visit (INDEPENDENT_AMBULATORY_CARE_PROVIDER_SITE_OTHER): Payer: 59 | Admitting: Family Medicine

## 2020-07-01 ENCOUNTER — Ambulatory Visit: Payer: 59 | Admitting: Family Medicine

## 2020-07-01 VITALS — BP 118/80 | HR 89 | Temp 99.3°F | Wt 224.0 lb

## 2020-07-01 DIAGNOSIS — F439 Reaction to severe stress, unspecified: Secondary | ICD-10-CM

## 2020-07-01 DIAGNOSIS — S161XXA Strain of muscle, fascia and tendon at neck level, initial encounter: Secondary | ICD-10-CM

## 2020-07-01 DIAGNOSIS — F419 Anxiety disorder, unspecified: Secondary | ICD-10-CM

## 2020-07-01 DIAGNOSIS — F321 Major depressive disorder, single episode, moderate: Secondary | ICD-10-CM

## 2020-07-01 DIAGNOSIS — F1290 Cannabis use, unspecified, uncomplicated: Secondary | ICD-10-CM

## 2020-07-01 MED ORDER — SERTRALINE HCL 25 MG PO TABS: 25.0000 mg | ORAL_TABLET | Freq: Every day | ORAL | 1 refills | Status: DC

## 2020-07-01 NOTE — Patient Instructions (Signed)
Cervical Strain and Sprain Rehab Ask your health care provider which exercises are safe for you. Do exercises exactly as told by your health care provider and adjust them as directed. It is normal to feel mild stretching, pulling, tightness, or discomfort as you do these exercises. Stop right away if you feel sudden pain or your pain gets worse. Do not begin these exercises until told by your health care provider. Stretching and range-of-motion exercises Cervical side bending  1. Using good posture, sit on a stable chair or stand up. 2. Without moving your shoulders, slowly tilt your left / right ear to your shoulder until you feel a stretch in the opposite side neck muscles. You should be looking straight ahead. 3. Hold for __________ seconds. 4. Repeat with the other side of your neck. Repeat __________ times. Complete this exercise __________ times a day. Cervical rotation  1. Using good posture, sit on a stable chair or stand up. 2. Slowly turn your head to the side as if you are looking over your left / right shoulder. ? Keep your eyes level with the ground. ? Stop when you feel a stretch along the side and the back of your neck. 3. Hold for __________ seconds. 4. Repeat this by turning to your other side. Repeat __________ times. Complete this exercise __________ times a day. Thoracic extension and pectoral stretch 1. Roll a towel or a small blanket so it is about 4 inches (10 cm) in diameter. 2. Lie down on your back on a firm surface. 3. Put the towel lengthwise, under your spine in the middle of your back. It should not be under your shoulder blades. The towel should line up with your spine from your middle back to your lower back. 4. Put your hands behind your head and let your elbows fall out to your sides. 5. Hold for __________ seconds. Repeat __________ times. Complete this exercise __________ times a day. Strengthening exercises Isometric upper cervical flexion 1. Lie on  your back with a thin pillow behind your head and a small rolled-up towel under your neck. 2. Gently tuck your chin toward your chest and nod your head down to look toward your feet. Do not lift your head off the pillow. 3. Hold for __________ seconds. 4. Release the tension slowly. Relax your neck muscles completely before you repeat this exercise. Repeat __________ times. Complete this exercise __________ times a day. Isometric cervical extension  1. Stand about 6 inches (15 cm) away from a wall, with your back facing the wall. 2. Place a soft object, about 6-8 inches (15-20 cm) in diameter, between the back of your head and the wall. A soft object could be a small pillow, a ball, or a folded towel. 3. Gently tilt your head back and press into the soft object. Keep your jaw and forehead relaxed. 4. Hold for __________ seconds. 5. Release the tension slowly. Relax your neck muscles completely before you repeat this exercise. Repeat __________ times. Complete this exercise __________ times a day. Posture and body mechanics Body mechanics refers to the movements and positions of your body while you do your daily activities. Posture is part of body mechanics. Good posture and healthy body mechanics can help to relieve stress in your body's tissues and joints. Good posture means that your spine is in its natural S-curve position (your spine is neutral), your shoulders are pulled back slightly, and your head is not tipped forward. The following are general guidelines for applying improved   posture and body mechanics to your everyday activities. Sitting  1. When sitting, keep your spine neutral and keep your feet flat on the floor. Use a footrest, if necessary, and keep your thighs parallel to the floor. Avoid rounding your shoulders, and avoid tilting your head forward. 2. When working at a desk or a computer, keep your desk at a height where your hands are slightly lower than your elbows. Slide your  chair under your desk so you are close enough to maintain good posture. 3. When working at a computer, place your monitor at a height where you are looking straight ahead and you do not have to tilt your head forward or downward to look at the screen. Standing   When standing, keep your spine neutral and keep your feet about hip-width apart. Keep a slight bend in your knees. Your ears, shoulders, and hips should line up.  When you do a task in which you stand in one place for a long time, place one foot up on a stable object that is 2-4 inches (5-10 cm) high, such as a footstool. This helps keep your spine neutral. Resting When lying down and resting, avoid positions that are most painful for you. Try to support your neck in a neutral position. You can use a contour pillow or a small rolled-up towel. Your pillow should support your neck but not push on it. This information is not intended to replace advice given to you by your health care provider. Make sure you discuss any questions you have with your health care provider. Document Revised: 12/24/2018 Document Reviewed: 06/04/2018 Elsevier Patient Education  2020 [employer]  Managing Anxiety, Adult After being diagnosed with an anxiety disorder, you may be relieved to know why you have felt or behaved a certain way. You may also feel overwhelmed about the treatment ahead and what it will mean for your life. With care and support, you can manage this condition and recover from it. How to manage lifestyle changes Managing stress and anxiety  Stress is your body's reaction to life changes and events, both good and bad. Most stress will last just a few hours, but stress can be ongoing and can lead to more than just stress. Although stress can play a major role in anxiety, it is not the same as anxiety. Stress is usually caused by something external, such as a deadline, test, or competition. Stress normally passes after the triggering event has  ended.  Anxiety is caused by something internal, such as imagining a terrible outcome or worrying that something will go wrong that will devastate you. Anxiety often does not go away even after the triggering event is over, and it can become long-term (chronic) worry. It is important to understand the differences between stress and anxiety and to manage your stress effectively so that it does not lead to an anxious response. Talk with your health care provider or a counselor to learn more about reducing anxiety and stress. He or she may suggest tension reduction techniques, such as:  Music therapy. This can include creating or listening to music that you enjoy and that inspires you.  Mindfulness-based meditation. This involves being aware of your normal breaths while not trying to control your breathing. It can be done while sitting or walking.  Centering prayer. This involves focusing on a word, phrase, or sacred image that means something to you and brings you peace.  Deep breathing. To do this, expand your stomach and inhale slowly  through your nose. Hold your breath for 3-5 seconds. Then exhale slowly, letting your stomach muscles relax.  Self-talk. This involves identifying thought patterns that lead to anxiety reactions and changing those patterns.  Muscle relaxation. This involves tensing muscles and then relaxing them. Choose a tension reduction technique that suits your lifestyle and personality. These techniques take time and practice. Set aside 5-15 minutes a day to do them. Therapists can offer counseling and training in these techniques. The training to help with anxiety may be covered by some insurance plans. Other things you can do to manage stress and anxiety include:  Keeping a stress/anxiety diary. This can help you learn what triggers your reaction and then learn ways to manage your response.  Thinking about how you react to certain situations. You may not be able to control  everything, but you can control your response.  Making time for activities that help you relax and not feeling guilty about spending your time in this way.  Visual imagery and yoga can help you stay calm and relax.  Medicines Medicines can help ease symptoms. Medicines for anxiety include:  Anti-anxiety drugs.  Antidepressants. Medicines are often used as a primary treatment for anxiety disorder. Medicines will be prescribed by a health care provider. When used together, medicines, psychotherapy, and tension reduction techniques may be the most effective treatment. Relationships Relationships can play a big part in helping you recover. Try to spend more time connecting with trusted friends and family members. Consider going to couples counseling, taking family education classes, or going to family therapy. Therapy can help you and others better understand your condition. How to recognize changes in your anxiety Everyone responds differently to treatment for anxiety. Recovery from anxiety happens when symptoms decrease and stop interfering with your daily activities at home or work. This may mean that you will start to:  Have better concentration and focus. Worry will interfere less in your daily thinking.  Sleep better.  Be less irritable.  Have more energy.  Have improved memory. It is important to recognize when your condition is getting worse. Contact your health care provider if your symptoms interfere with home or work and you feel like your condition is not improving. Follow these instructions at home: Activity  Exercise. Most adults should do the following: ? Exercise for at least 150 minutes each week. The exercise should increase your heart rate and make you sweat (moderate-intensity exercise). ? Strengthening exercises at least twice a week.  Get the right amount and quality of sleep. Most adults need 7-9 hours of sleep each night. Lifestyle   Eat a healthy diet that  includes plenty of vegetables, fruits, whole grains, low-fat dairy products, and lean protein. Do not eat a lot of foods that are high in solid fats, added sugars, or salt.  Make choices that simplify your life.  Do not use any products that contain nicotine or tobacco, such as cigarettes, e-cigarettes, and chewing tobacco. If you need help quitting, ask your health care provider.  Avoid caffeine, alcohol, and certain over-the-counter cold medicines. These may make you feel worse. Ask your pharmacist which medicines to avoid. General instructions  Take over-the-counter and prescription medicines only as told by your health care provider.  Keep all follow-up visits as told by your health care provider. This is important. Where to find support You can get help and support from these sources:  Self-help groups.  Online and [employer].  A trusted spiritual leader.  Couples counseling.  Family education classes.  Family therapy. Where to find more information You may find that joining a support group helps you deal with your anxiety. The following sources can help you locate counselors or support groups near you:  Mental Health America: www.mentalhealthamerica.net  Anxiety and Depression Association of [place] (ADAA): ProgramCam.de  [employer] Mental [employer]ness (NAMI): www.nami.org Contact a health care provider if you:  Have a hard time staying focused or finishing daily tasks.  Spend many hours a day feeling worried about everyday life.  Become exhausted by worry.  Start to have headaches, feel tense, or have nausea.  Urinate more than normal.  Have diarrhea. Get help right away if you have:  A racing heart and shortness of breath.  Thoughts of hurting yourself or others. If you ever feel like you may hurt yourself or others, or have thoughts about taking your own life, get help right away. You can go to your nearest emergency department or  call:  Your local emergency services (911 in the [place]  A suicide crisis helpline, such as the National Suicide Prevention Lifeline at 202-391-0768. This is open 24 hours a day. Summary  Taking steps to learn and use tension reduction techniques can help calm you and help prevent triggering an anxiety reaction.  When used together, medicines, psychotherapy, and tension reduction techniques may be the most effective treatment.  Family, friends, and partners can play a big part in helping you recover from an anxiety disorder. This information is not intended to replace advice given to you by your health care provider. Make sure you discuss any questions you have with your health care provider. Document Revised: 02/03/2019 Document Reviewed: 02/03/2019 Elsevier Patient Education  2020 Elsevier Inc.  Living With Depression Everyone experiences occasional disappointment, sadness, and loss in their lives. When you are feeling down, blue, or sad for at least 2 weeks in a row, it may mean that you have depression. Depression can affect your thoughts and feelings, relationships, daily activities, and physical health. It is caused by changes in the way your brain functions. If you receive a diagnosis of depression, your health care provider will tell you which type of depression you have and what treatment options are available to you. If you are living with depression, there are ways to help you recover from it and also ways to prevent it from coming back. How to cope with lifestyle changes Coping with stress     Stress is your body's reaction to life changes and events, both good and bad. Stressful situations may include:  Getting married.  The death of a spouse.  Losing a job.  Retiring.  Having a baby. Stress can last just a few hours or it can be ongoing. Stress can play a major role in depression, so it is important to learn both how to cope with stress and how to think about it  differently. Talk with your health care provider or a counselor if you would like to learn more about stress reduction. He or she may suggest some stress reduction techniques, such as:  Music therapy. This can include creating music or listening to music. Choose music that you enjoy and that inspires you.  Mindfulness-based meditation. This kind of meditation can be done while sitting or walking. It involves being aware of your normal breaths, rather than trying to control your breathing.  Centering prayer. This is a kind of meditation that involves focusing on a spiritual word or phrase. Choose a word,  phrase, or sacred image that is meaningful to you and that brings you peace.  Deep breathing. To do this, expand your stomach and inhale slowly through your nose. Hold your breath for 3-5 seconds, then exhale slowly, allowing your stomach muscles to relax.  Muscle relaxation. This involves intentionally tensing muscles then relaxing them. Choose a stress reduction technique that fits your lifestyle and personality. Stress reduction techniques take time and practice to develop. Set aside 5-15 minutes a day to do them. Therapists can offer training in these techniques. The training may be covered by some insurance plans. Other things you can do to manage stress include:  Keeping a stress diary. This can help you learn what triggers your stress and ways to control your response.  Understanding what your limits are and saying no to requests or events that lead to a schedule that is too full.  Thinking about how you respond to certain situations. You may not be able to control everything, but you can control how you react.  Adding humor to your life by watching funny films or TV shows.  Making time for activities that help you relax and not feeling guilty about spending your time this way.  Medicines Your health care provider may suggest certain medicines if he or she feels that they will help  improve your condition. Avoid using alcohol and other substances that may prevent your medicines from working properly (may interact). It is also important to:  Talk with your pharmacist or health care provider about all the medicines that you take, their possible side effects, and what medicines are safe to take together.  Make it your goal to take part in all treatment decisions (shared decision-making). This includes giving input on the side effects of medicines. It is best if shared decision-making with your health care provider is part of your total treatment plan. If your health care provider prescribes a medicine, you may not notice the full benefits of it for 4-8 weeks. Most people who are treated for depression need to be on medicine for at least 6-12 months after they feel better. If you are taking medicines as part of your treatment, do not stop taking medicines without first talking to your health care provider. You may need to have the medicine slowly decreased (tapered) over time to decrease the risk of harmful side effects. Relationships Your health care provider may suggest family therapy along with individual therapy and drug therapy. While there may not be family problems that are causing you to feel depressed, it is still important to make sure your family learns as much as they can about your mental health. Having your family's support can help make your treatment successful. How to recognize changes in your condition Everyone has a different response to treatment for depression. Recovery from major depression happens when you have not had signs of major depression for two months. This may mean that you will start to:  Have more interest in doing activities.  Feel less hopeless than you did 2 months ago.  Have more energy.  Overeat less often, or have better or improving appetite.  Have better concentration. Your health care provider will work with you to decide the next steps  in your recovery. It is also important to recognize when your condition is getting worse. Watch for these signs:  Having fatigue or low energy.  Eating too much or too little.  Sleeping too much or too little.  Feeling restless, agitated, or hopeless.  Having trouble concentrating or making decisions.  Having unexplained physical complaints.  Feeling irritable, angry, or aggressive. Get help as soon as you or your family members notice these symptoms coming back. How to get support and help from others How to talk with friends and family members about your condition  Talking to friends and family members about your condition can provide you with one way to get support and guidance. Reach out to trusted friends or family members, explain your symptoms to them, and let them know that you are working with a health care provider to treat your depression. Financial resources Not all insurance plans cover mental health care, so it is important to check with your insurance carrier. If paying for co-pays or counseling services is a problem, search for a local or county mental health care center. They may be able to offer public mental health care services at low or no cost when you are not able to see a private health care provider. If you are taking medicine for depression, you may be able to get the generic form, which may be less expensive. Some makers of prescription medicines also offer help to patients who cannot afford the medicines they need. Follow these instructions at home:   Get the right amount and quality of sleep.  Cut down on using caffeine, tobacco, alcohol, and other potentially harmful substances.  Try to exercise, such as walking or lifting small weights.  Take over-the-counter and prescription medicines only as told by your health care provider.  Eat a healthy diet that includes plenty of vegetables, fruits, whole grains, low-fat dairy products, and lean protein. Do not  eat a lot of foods that are high in solid fats, added sugars, or salt.  Keep all follow-up visits as told by your health care provider. This is important. Contact a health care provider if:  You stop taking your antidepressant medicines, and you have any of these symptoms: ? Nausea. ? Headache. ? Feeling lightheaded. ? Chills and body aches. ? Not being able to sleep (insomnia).  You or your friends and family think your depression is getting worse. Get help right away if:  You have thoughts of hurting yourself or others. If you ever feel like you may hurt yourself or others, or have thoughts about taking your own life, get help right away. You can go to your nearest emergency department or call:  Your local emergency services (911 in the [place]  A suicide crisis helpline, such as the National Suicide Prevention Lifeline at 856 765 23931-307-596-1188. This is open 24-hours a day. Summary  If you are living with depression, there are ways to help you recover from it and also ways to prevent it from coming back.  Work with your health care team to create a management plan that includes counseling, stress management techniques, and healthy lifestyle habits. This information is not intended to replace advice given to you by your health care provider. Make sure you discuss any questions you have with your health care provider. Document Revised: 12/26/2018 Document Reviewed: 08/06/2016 Elsevier Patient Education  2020 [employer]

## 2020-07-01 NOTE — Progress Notes (Signed)
Subjective:    Patient ID: Mark Wade, male    DOB: 1996-09-16, 24 y.o.   MRN: [id number]  No chief complaint on file.   HPI Patient was seen today for ongoing concerns.  Pt endorses feeling anxious.  Pt notes increased stress in personal life.  Pt working 2 jobs to provide for his son, daughter, and himself.  Pt trying to communicate with the mother's of his children which at times can be challenging.  Pt had an unexpected expense 2/2 speeding ticket.  Pt using MJ TID or more, 8 blunts on a bad day.  Endorses neck pain x 2 wks.  Does not recall injury.  Notes ongonig decreased appetite, decreased focus, and little interest in doing things.  Pt interested in medication. Tried counseling.  Pt inquires about receiving the COVID-19 vaccine after recovering form COVID-19 infection.  Past Medical History:  Diagnosis Date  . Asthma   . Frequent headaches   . Irritable bowel syndrome (IBS)   . Migraine     Allergies  Allergen Reactions  . Other     Allergic to walnuts and pecans- throat swells up.  . Pollen Extract Itching    Watery eyes     ROS General: Denies fever, chills, night sweats, changes in weight  +changes in appetite HEENT: Denies headaches, ear pain, changes in vision, rhinorrhea, sore throat CV: Denies CP, palpitations, SOB, orthopnea Pulm: Denies SOB, cough, wheezing GI: Denies abdominal pain, nausea, vomiting, diarrhea, constipation GU: Denies dysuria, hematuria, frequency, vaginal discharge Msk: Denies muscle cramps, joint pains  +neck pain Neuro: Denies weakness, numbness, tingling Skin: Denies rashes, bruising Psych: Denies SI/HI, hallucinations  +anxiety, depression    Objective:    Blood pressure 118/80, pulse 89, temperature 99.3 F (37.4 C), temperature source Oral, weight 224 lb (101.6 kg), SpO2 99 %.  Gen. Pleasant, well-nourished, in no distress, normal affect   HEENT: [place]/AT, face symmetric, conjunctiva clear, no scleral icterus, PERRLA, EOMI, nares  patent without drainage Lungs: no accessory muscle use Cardiovascular: RRR, no peripheral edema Musculoskeletal: No deformities, no cyanosis or clubbing, normal tone Neuro:  A&Ox3, CN II-XII intact, normal gait Skin:  Warm, no lesions/ rash   Wt Readings from Last 3 Encounters:  07/01/20 224 lb (101.6 kg)  12/17/19 230 lb 12.8 oz (104.7 kg)  12/09/18 236 lb (107 kg)    Lab Results  Component Value Date   WBC 6.9 12/17/2019   HGB 14.3 12/17/2019   HCT 41.8 12/17/2019   PLT 182 12/17/2019   GLUCOSE 85 12/17/2019   CHOL 141 12/17/2019   TRIG 45 12/17/2019   HDL 43 12/17/2019   LDLCALC 85 12/17/2019   ALT 23 12/17/2019   AST 20 12/17/2019   NA 141 12/17/2019   K 3.9 12/17/2019   CL 107 12/17/2019   CREATININE 0.84 12/17/2019   BUN 13 12/17/2019   CO2 30 12/17/2019   HGBA1C 5.1 12/17/2019    Assessment/Plan:  Anxiety -GAD 7 score 17 -Reviewed various treatment options.  Patient interested in starting medication -Reviewed r/b/a -We will start Zoloft 25 mg daily. -Patient encouraged to restart counseling -Given handout -Given precautions - Plan: sertraline (ZOLOFT) 25 MG tablet  Depression, major, single episode, moderate (HCC)  -PHQ-9 score 16 -We will start Zoloft 25 mg daily -Patient encouraged to restart counseling -Given handout -Given precautions - Plan: sertraline (ZOLOFT) 25 MG tablet  Stress -Discussed ways to relieve/reduce stress -Discussed the importance of self-care -Patient advised to consider using a mediator to  improve communication with mothers of his children -Given handout  Strain of neck muscle, initial encounter -Discussed supportive care including heat, massage, stretching, NSAIDs as needed -Likely worsened by increased stress -We will hold off on muscle relaxer at this time given regular MJ use  Marijuana use -Increased.  Smoking 3-8 blunts/d -Discussed cutting down/quitting -Concern pt using as a coping mechanism -Discussed  stomach issues likely worsened by frequent use. -continue to monitor  F/u in 4-6 wks  Abbe Amsterdam, MD

## 2020-07-06 ENCOUNTER — Encounter: Payer: Self-pay | Admitting: Family Medicine

## 2020-07-06 DIAGNOSIS — F1290 Cannabis use, unspecified, uncomplicated: Secondary | ICD-10-CM | POA: Insufficient documentation

## 2020-07-06 DIAGNOSIS — F321 Major depressive disorder, single episode, moderate: Secondary | ICD-10-CM | POA: Insufficient documentation

## 2020-07-06 DIAGNOSIS — F419 Anxiety disorder, unspecified: Secondary | ICD-10-CM | POA: Insufficient documentation

## 2020-08-03 ENCOUNTER — Ambulatory Visit: Payer: 59 | Admitting: Family Medicine

## 2020-08-04 ENCOUNTER — Ambulatory Visit (INDEPENDENT_AMBULATORY_CARE_PROVIDER_SITE_OTHER): Payer: 59 | Admitting: Family Medicine

## 2020-08-04 ENCOUNTER — Encounter: Payer: Self-pay | Admitting: Family Medicine

## 2020-08-04 ENCOUNTER — Other Ambulatory Visit: Payer: Self-pay

## 2020-08-04 VITALS — BP 128/66 | HR 67 | Temp 98.7°F | Wt 222.6 lb

## 2020-08-04 DIAGNOSIS — Z113 Encounter for screening for infections with a predominantly sexual mode of transmission: Secondary | ICD-10-CM

## 2020-08-04 LAB — POCT URINALYSIS DIPSTICK
Bilirubin, UA: NEGATIVE
Blood, UA: NEGATIVE
Glucose, UA: NEGATIVE
Ketones, UA: NEGATIVE
Leukocytes, UA: NEGATIVE
Nitrite, UA: NEGATIVE
Protein, UA: POSITIVE — AB
Spec Grav, UA: 1.015 (ref 1.010–1.025)
Urobilinogen, UA: 1 E.U./dL
pH, UA: 7 (ref 5.0–8.0)

## 2020-08-04 NOTE — Addendum Note (Signed)
Addended by: Lerry Liner on: 08/04/2020 11:11 AM   Modules accepted: Orders

## 2020-08-04 NOTE — Progress Notes (Signed)
Subjective:    Patient ID: Mark Wade, male    DOB: 05/07/1996, 24 y.o.   MRN: [id number]  No chief complaint on file.   HPI Patient was seen today for follow-up. Patient states he has been doing better overall. Patient did not start taking Zoloft 25 mg for anxiety and depression. Patient notes changing jobs seemed to help some.   Pt has a trip planned for his birthday and his daughter's to [place].  Patient interested in STI screening. States things feel "different".  Denies dysuria, d/c, rash/lesions, n/v, edema.  Past Medical History:  Diagnosis Date  . Asthma   . Frequent headaches   . Irritable bowel syndrome (IBS)   . Migraine     Allergies  Allergen Reactions  . Other     Allergic to walnuts and pecans- throat swells up.  . Pollen Extract Itching    Watery eyes     ROS General: Denies fever, chills, night sweats, changes in weight, changes in appetite HEENT: Denies headaches, ear pain, changes in vision, rhinorrhea, sore throat CV: Denies CP, palpitations, SOB, orthopnea Pulm: Denies SOB, cough, wheezing GI: Denies abdominal pain, nausea, vomiting, diarrhea, constipation GU: Denies dysuria, hematuria, frequency Msk: Denies muscle cramps, joint pains Neuro: Denies weakness, numbness, tingling Skin: Denies rashes, bruising Psych: Denies depression, anxiety, hallucinations     Objective:    Blood pressure 128/66, pulse 67, temperature 98.7 F (37.1 C), temperature source Oral, weight 222 lb 9.6 oz (101 kg), SpO2 99 %.   Gen. Pleasant, well-nourished, in no distress, normal affect   HEENT: [place]/AT, face symmetric, conjunctiva clear, no scleral icterus, PERRLA, EOMI, nares patent without drainage Lungs: no accessory muscle use Cardiovascular: RRR, no peripheral edema Musculoskeletal: No deformities, no cyanosis or clubbing, normal tone Neuro:  A&Ox3, CN II-XII intact, normal gait Skin:  Warm, no lesions/ rash   Wt Readings from Last 3 Encounters:  07/01/20 224  lb (101.6 kg)  12/17/19 230 lb 12.8 oz (104.7 kg)  12/09/18 236 lb (107 kg)    Lab Results  Component Value Date   WBC 6.9 12/17/2019   HGB 14.3 12/17/2019   HCT 41.8 12/17/2019   PLT 182 12/17/2019   GLUCOSE 85 12/17/2019   CHOL 141 12/17/2019   TRIG 45 12/17/2019   HDL 43 12/17/2019   LDLCALC 85 12/17/2019   ALT 23 12/17/2019   AST 20 12/17/2019   NA 141 12/17/2019   K 3.9 12/17/2019   CL 107 12/17/2019   CREATININE 0.84 12/17/2019   BUN 13 12/17/2019   CO2 30 12/17/2019   HGBA1C 5.1 12/17/2019    Assessment/Plan:  Routine screening for STI (sexually transmitted infection)  - Plan: HIV Antibody (routine testing w rflx), RPR, C. trachomatis/N. gonorrhoeae RNA, POCT urinalysis dipstick  F/u prn  Abbe Amsterdam, MD

## 2020-08-04 NOTE — Addendum Note (Signed)
Addended by: Carola Rhine on: 08/04/2020 11:06 AM   Modules accepted: Orders

## 2020-08-05 LAB — HIV ANTIBODY (ROUTINE TESTING W REFLEX): HIV 1&2 Ab, 4th Generation: NONREACTIVE

## 2020-08-05 LAB — C. TRACHOMATIS/N. GONORRHOEAE RNA
C. trachomatis RNA, TMA: NOT DETECTED
N. gonorrhoeae RNA, TMA: NOT DETECTED

## 2020-08-05 LAB — RPR: RPR Ser Ql: NONREACTIVE

## 2020-08-09 ENCOUNTER — Telehealth: Payer: Self-pay

## 2020-08-09 NOTE — Telephone Encounter (Signed)
Reviewed Mark Wade lab results together verbalized understanding. Mark Wade stated that he is still having the intense sensation which comes and goes and wants to know if Dr Salomon Fick recommends anything since his lab results came back normal.

## 2020-08-15 NOTE — Telephone Encounter (Signed)
Can repeat testing with aptima swab.  Avoid scented soaps, lotions, or detergents.

## 2020-08-18 NOTE — Telephone Encounter (Signed)
Spoke with pt given Dr Salomon Fick advise, Pt schedule for appointment to repeat the Aptima swab on 08/25/2020 at 8 am

## 2020-08-25 ENCOUNTER — Ambulatory Visit: Payer: 59 | Admitting: Family Medicine

## 2021-04-26 ENCOUNTER — Encounter: Payer: Self-pay | Admitting: Family Medicine

## 2021-04-26 ENCOUNTER — Other Ambulatory Visit: Payer: Self-pay

## 2021-04-26 ENCOUNTER — Ambulatory Visit: Payer: 59 | Admitting: Family Medicine

## 2021-04-26 VITALS — BP 128/96 | HR 60 | Temp 98.5°F | Wt 247.6 lb

## 2021-04-26 DIAGNOSIS — F411 Generalized anxiety disorder: Secondary | ICD-10-CM | POA: Diagnosis not present

## 2021-04-26 DIAGNOSIS — F331 Major depressive disorder, recurrent, moderate: Secondary | ICD-10-CM | POA: Diagnosis not present

## 2021-04-26 DIAGNOSIS — R030 Elevated blood-pressure reading, without diagnosis of hypertension: Secondary | ICD-10-CM | POA: Diagnosis not present

## 2021-04-26 DIAGNOSIS — Z113 Encounter for screening for infections with a predominantly sexual mode of transmission: Secondary | ICD-10-CM

## 2021-04-26 LAB — TSH: TSH: 0.76 u[IU]/mL (ref 0.35–5.50)

## 2021-04-26 LAB — T4, FREE: Free T4: 0.81 ng/dL (ref 0.60–1.60)

## 2021-04-26 NOTE — Progress Notes (Signed)
Subjective:    Patient ID: Mark Wade, male    DOB: [birth year], 25 y.o.   MRN: [id number]  Chief Complaint  Patient presents with   Mental Health Problem   HPI Patient was seen today for ongoing concern.  Pt endorses increase stress, anxiety, and depression.  In the past given rx for zoloft 25 mg, but never started.  Pt endorses worsening symptoms including depressed mood that are starting to affect him at work and in personal life.  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  Interested in counseling.  Pt also interested in STI testing.  Past Medical History:  Diagnosis Date   Asthma    Frequent headaches    Irritable bowel syndrome (IBS)    Migraine     Allergies  Allergen Reactions   Other     Allergic to walnuts and pecans- throat swells up.   Pollen Extract Itching    Watery eyes     ROS General: Denies fever, chills, night sweats, changes in weight + changes in appetite, decreased energy, changes in sleep HEENT: Denies headaches, ear pain, changes in vision, rhinorrhea, sore throat CV: Denies CP, palpitations, SOB, orthopnea Pulm: Denies SOB, cough, wheezing GI: Denies abdominal pain, nausea, vomiting, diarrhea, constipation GU: Denies dysuria, hematuria, frequency, vaginal discharge Msk: Denies muscle cramps, joint pains Neuro: Denies weakness, numbness, tingling Skin: Denies rashes, bruising Psych: Denies hallucinations  +depression, anxiety    Objective:    Blood pressure (!) 128/96, pulse 60, temperature 98.5 F (36.9 C), temperature source Oral, weight 247 lb 9.6 oz (112.3 kg), SpO2 98 %.  Gen. Pleasant, well-nourished, in no distress, depressed affect   HEENT: [place]/AT, face symmetric, conjunctiva clear, no scleral icterus, PERRLA, EOMI, nares patent without drainage Lungs: no accessory muscle use Cardiovascular: RRR, no peripheral edema. Musculoskeletal: No deformities, no cyanosis or clubbing, normal tone Neuro:  A&Ox3, CN II-XII  intact, normal gait Skin:  Warm, no lesions/ rash   Wt Readings from Last 3 Encounters:  04/26/21 247 lb 9.6 oz (112.3 kg)  08/04/20 222 lb 9.6 oz (101 kg)  07/01/20 224 lb (101.6 kg)    Lab Results  Component Value Date   WBC 6.9 12/17/2019   HGB 14.3 12/17/2019   HCT 41.8 12/17/2019   PLT 182 12/17/2019   GLUCOSE 85 12/17/2019   CHOL 141 12/17/2019   TRIG 45 12/17/2019   HDL 43 12/17/2019   LDLCALC 85 12/17/2019   ALT 23 12/17/2019   AST 20 12/17/2019   NA 141 12/17/2019   K 3.9 12/17/2019   CL 107 12/17/2019   CREATININE 0.84 12/17/2019   BUN 13 12/17/2019   CO2 30 12/17/2019   HGBA1C 5.1 12/17/2019   GAD 7 : Generalized Anxiety Score 04/26/2021 07/06/2020  Nervous, Anxious, on Edge 2 2  Control/stop worrying 3 3  Worry too much - different things 3 3  Trouble relaxing 2 3  Restless 2 2  Easily annoyed or irritable 3 2  Afraid - awful might happen 3 2  Total GAD 7 Score 18 17  Anxiety Difficulty Extremely difficult Very difficult    Depression screen [facility] 2/9 04/26/2021 07/06/2020 12/17/2019  Decreased Interest 2 2 0  Down, Depressed, Hopeless 1 2 0  PHQ - 2 Score 3 4 0  Altered sleeping 3 1 -  Tired, decreased energy 1 2 -  Change in appetite 3 3 -  Feeling bad or failure about yourself  2 2 -  Trouble concentrating - 2 -  Moving slowly or fidgety/restless 1 2 -  Suicidal thoughts 0 0 -  PHQ-9 Score 13 16 -  Difficult doing work/chores Extremely dIfficult Very difficult -    Assessment/Plan:  GAD (generalized anxiety disorder)  -GAD7 score 18 -symptoms increasing -discussed counseling and medication options. -given info on area [facility] providers.  Pt to schedule appt. -has rx for zoloft 25 mg daily.  will start. -given strict precautions - Plan: TSH, T4, Free  Moderate episode of recurrent major depressive disorder (HCC)  -PHQ9 score 13 -mediation and counseling -given info -start zoloft 25 mg daily.  Has medication -given strict precautions -  Plan: TSH, T4, Free  Routine screening for STI (sexually transmitted infection)  - Plan: C. trachomatis/N. gonorrhoeae RNA, RPR, HIV Antibody (routine testing w rflx)  Elevated blood pressure reading in office without diagnosis of HTN -recheck  -likely elevated to increased anxiety -lifestyle modifications -for continued elevation start medication  F/u in 4-6 wks, sooner if needed  Abbe Amsterdam, MD

## 2021-04-26 NOTE — Patient Instructions (Addendum)
Dr. Jannifer Franklin is a Psychiatrist with [facility]. [phone] [email or web address].  Located off of [address]. across from [facility] is another place in town that has counseling and Psychiatry services.  [address]  [phone]  Mental Health Apps and Websites Here are a few free apps meant to help you to help yourself.  To find, try searching on the internet to see if the app is offered on Apple/Android devices. If your first choice doesn't come up on your device, the good news is that there are many choices! Play around with different apps to see which ones are helpful to you . Calm This is an app meant to help increase calm feelings. Includes info, strategies, and tools for tracking your feelings.   Calm Harm  This app is meant to help with self-harm. Provides many 5-minute or 15-min coping strategies for doing instead of hurting yourself.    Healthy Minds Health Minds is a problem-solving tool to help deal with emotions and cope with stress you encounter wherever you are.    MindShift This app can help people cope with anxiety. Rather than trying to avoid anxiety, you can make an important shift and face it.    MY3  MY3 features a support system, safety plan and resources with the goal of offering a tool to use in a time of need.    My Life My Voice  This mood journal offers a simple solution for tracking your thoughts, feelings and moods. Animated emoticons can help identify your mood.   Relax Melodies Designed to help with sleep, on this app you can mix sounds and meditations for relaxation.    Smiling Mind Smiling Mind is meditation made easy: it's a simple tool that helps put a smile on your mind.    Stop, Breathe & Think  A friendly, simple guide for people through meditations for mindfulness and compassion.  Stop, Breathe and Think Kids Enter your  current feelings and choose a "mission" to help you cope. Offers videos for certain moods instead of just sound recordings.     The [employer] Box The [employer] Box (VHB) contains simple tools to help patients with coping, relaxation, distraction, and positive thinking.

## 2021-04-27 LAB — C. TRACHOMATIS/N. GONORRHOEAE RNA
C. trachomatis RNA, TMA: NOT DETECTED
N. gonorrhoeae RNA, TMA: NOT DETECTED

## 2021-04-27 LAB — RPR: RPR Ser Ql: NONREACTIVE

## 2021-04-27 LAB — HIV ANTIBODY (ROUTINE TESTING W REFLEX): HIV 1&2 Ab, 4th Generation: NONREACTIVE

## 2021-06-19 ENCOUNTER — Ambulatory Visit: Payer: 59 | Admitting: Family Medicine

## 2021-08-09 ENCOUNTER — Ambulatory Visit: Payer: 59 | Admitting: Family Medicine

## 2021-08-16 ENCOUNTER — Ambulatory Visit: Payer: 59 | Admitting: Family Medicine

## 2021-10-19 ENCOUNTER — Encounter: Payer: Self-pay | Admitting: Family Medicine

## 2021-10-19 ENCOUNTER — Ambulatory Visit (INDEPENDENT_AMBULATORY_CARE_PROVIDER_SITE_OTHER): Payer: 59 | Admitting: Family Medicine

## 2021-10-19 ENCOUNTER — Other Ambulatory Visit: Payer: Self-pay | Admitting: Family Medicine

## 2021-10-19 VITALS — BP 120/80 | HR 75 | Temp 97.8°F | Ht 74.0 in | Wt 252.6 lb

## 2021-10-19 DIAGNOSIS — J45990 Exercise induced bronchospasm: Secondary | ICD-10-CM | POA: Diagnosis not present

## 2021-10-19 DIAGNOSIS — Z0000 Encounter for general adult medical examination without abnormal findings: Secondary | ICD-10-CM

## 2021-10-19 DIAGNOSIS — R202 Paresthesia of skin: Secondary | ICD-10-CM | POA: Diagnosis not present

## 2021-10-19 DIAGNOSIS — Z13220 Encounter for screening for lipoid disorders: Secondary | ICD-10-CM

## 2021-10-19 DIAGNOSIS — Z0001 Encounter for general adult medical examination with abnormal findings: Secondary | ICD-10-CM | POA: Diagnosis not present

## 2021-10-19 DIAGNOSIS — L659 Nonscarring hair loss, unspecified: Secondary | ICD-10-CM

## 2021-10-19 DIAGNOSIS — B353 Tinea pedis: Secondary | ICD-10-CM

## 2021-10-19 DIAGNOSIS — Z113 Encounter for screening for infections with a predominantly sexual mode of transmission: Secondary | ICD-10-CM | POA: Diagnosis not present

## 2021-10-19 LAB — COMPREHENSIVE METABOLIC PANEL
ALT: 34 U/L (ref 0–53)
AST: 28 U/L (ref 0–37)
Albumin: 5 g/dL (ref 3.5–5.2)
Alkaline Phosphatase: 50 U/L (ref 39–117)
BUN: 14 mg/dL (ref 6–23)
CO2: 32 mEq/L (ref 19–32)
Calcium: 10.4 mg/dL (ref 8.4–10.5)
Chloride: 103 mEq/L (ref 96–112)
Creatinine, Ser: 0.91 mg/dL (ref 0.40–1.50)
GFR: 115.79 mL/min (ref >60.00)
Glucose, Bld: 79 mg/dL (ref 70–99)
Potassium: 3.9 mEq/L (ref 3.5–5.1)
Sodium: 140 mEq/L (ref 135–145)
Total Bilirubin: 0.7 mg/dL (ref 0.2–1.2)
Total Protein: 8.2 g/dL (ref 6.0–8.3)

## 2021-10-19 LAB — LIPID PANEL
Cholesterol: 178 mg/dL (ref 0–200)
HDL: 47.9 mg/dL (ref >39.00)
LDL Cholesterol: 118 mg/dL — ABNORMAL HIGH (ref 0–99)
NonHDL: 129.85
Total CHOL/HDL Ratio: 4
Triglycerides: 57 mg/dL (ref 0.0–149.0)
VLDL: 11.4 mg/dL (ref 0.0–40.0)

## 2021-10-19 LAB — CBC WITH DIFFERENTIAL/PLATELET
Basophils Absolute: 0 10*3/uL (ref 0.0–0.1)
Basophils Relative: 0.5 % (ref 0.0–3.0)
Eosinophils Absolute: 0.1 10*3/uL (ref 0.0–0.7)
Eosinophils Relative: 1.5 % (ref 0.0–5.0)
HCT: 42.6 % (ref 39.0–52.0)
Hemoglobin: 14.8 g/dL (ref 13.0–17.0)
Lymphocytes Relative: 46.6 % — ABNORMAL HIGH (ref 12.0–46.0)
Lymphs Abs: 3.6 10*3/uL (ref 0.7–4.0)
MCHC: 34.7 g/dL (ref 30.0–36.0)
MCV: 88.4 fl (ref 78.0–100.0)
Monocytes Absolute: 0.4 10*3/uL (ref 0.1–1.0)
Monocytes Relative: 5.1 % (ref 3.0–12.0)
Neutro Abs: 3.6 10*3/uL (ref 1.4–7.7)
Neutrophils Relative %: 46.3 % (ref 43.0–77.0)
Platelets: 212 10*3/uL (ref 150.0–400.0)
RBC: 4.82 Mil/uL (ref 4.22–5.81)
RDW: 12.9 % (ref 11.5–15.5)
WBC: 7.7 10*3/uL (ref 4.0–10.5)

## 2021-10-19 LAB — HEMOGLOBIN A1C: Hgb A1c MFr Bld: 5.8 % (ref 4.6–6.5)

## 2021-10-19 LAB — T4, FREE: Free T4: 0.88 ng/dL (ref 0.60–1.60)

## 2021-10-19 LAB — TSH: TSH: 0.43 u[IU]/mL (ref 0.35–5.50)

## 2021-10-19 LAB — VITAMIN B12: Vitamin B-12: 151 pg/mL — ABNORMAL LOW (ref 211–911)

## 2021-10-19 MED ORDER — CLOTRIMAZOLE-BETAMETHASONE 1-0.05 % EX CREA: 1.0000 "application " | TOPICAL_CREAM | Freq: Two times a day (BID) | CUTANEOUS | 1 refills | Status: DC

## 2021-10-19 MED ORDER — ALBUTEROL SULFATE HFA 108 (90 BASE) MCG/ACT IN AERS: 2.0000 | INHALATION_SPRAY | Freq: Four times a day (QID) | RESPIRATORY_TRACT | 1 refills | Status: DC | PRN

## 2021-10-19 NOTE — Progress Notes (Signed)
Subjective:     Mark Wade is a 26 y.o. male and is here for a comprehensive physical exam. The patient reports trying to get in shape and take better care of his health.  Smoking less.  Patient plans to start exercising next week.  Wants to lose 40-50 pounds.  Endorses difficulty finding time in schedule as he was given a promotion at work which comes with increased stress.  Patient notes concerns about hair loss.  States hair is thinning.  Unsure if the men's family had similar issues as they are bald.  Patient endorses numbness, tingling, pain in right hand.  Had an episode the other day where pain started in right wrist and went into right middle finger.  Patient had to put down what he was holding in order to keep from dropping it.  Patient denies pain at night that wakes him up.  Patient inquires about a refill on Lotrisone cream for episodic tinea pedis.  Patient also inquires about refill on albuterol as has history of exercise-induced asthma.  Patient states his 2 kids also have asthma.  Social History   Socioeconomic History   Marital status: Single    Spouse name: Not on file   Number of children: Not on file   Years of education: Not on file   Highest education level: Not on file  Occupational History   Not on file  Tobacco Use   Smoking status: Some Days    Types: Cigarettes   Smokeless tobacco: Never  Substance and Sexual Activity   Alcohol use: No   Drug use: Yes    Types: Marijuana   Sexual activity: Yes  Other Topics Concern   Not on file  Social History Narrative   Not on file   Social Determinants of Health   Financial Resource Strain: Not on file  Food Insecurity: Not on file  Transportation Needs: Not on file  Physical Activity: Not on file  Stress: Not on file  Social Connections: Not on file  Intimate Partner Violence: Not on file   Health Maintenance  Topic Date Due   COVID-19 Vaccine (1) Never done   Hepatitis C Screening  Never done   INFLUENZA  VACCINE  04/17/2021   TETANUS/TDAP  10/02/2022   HIV Screening  Completed   HPV VACCINES  Aged Out    The following portions of the patient's history were reviewed and updated as appropriate: allergies, current medications, past family history, past medical history, past social history, past surgical history, and problem list.  Review of Systems Pertinent items noted in HPI and remainder of comprehensive ROS otherwise negative.   Objective:    BP 120/80 (BP Location: Left Arm, Patient Position: Sitting, Cuff Size: Normal)    Pulse 75    Temp 97.8 F (36.6 C) (Oral)    Ht 6\' 2"  (1.88 m)    Wt 252 lb 9.6 oz (114.6 kg)    SpO2 100%    BMI 32.43 kg/m  General appearance: alert, cooperative, and no distress Head: Normocephalic, without obvious abnormality, atraumatic Eyes: conjunctivae/corneas clear. PERRL, EOM's intact. Fundi benign. Ears: normal TM's and external ear canals both ears Nose: Nares normal. Septum midline. Mucosa normal. No drainage or sinus tenderness. Throat: lips, mucosa, and tongue normal; teeth and gums normal Neck: no adenopathy, no carotid bruit, no JVD, supple, symmetrical, trachea midline, and thyroid not enlarged, symmetric, no tenderness/mass/nodules Lungs: clear to auscultation bilaterally Heart: regular rate and rhythm, S1, S2 normal, no murmur, click,  rub or gallop Abdomen: soft, non-tender; bowel sounds normal; no masses,  no organomegaly Extremities: extremities normal, atraumatic, no cyanosis or edema Pulses: 2+ and symmetric Skin: Skin color, texture, turgor normal. No rashes or lesions Lymph nodes: Cervical, supraclavicular, and axillary nodes normal. Neurologic: Alert and oriented X 3, normal strength and tone. Normal symmetric reflexes. Normal coordination and gait    Assessment:    Healthy male exam.      Plan:    Anticipatory guidance given including wearing seatbelts, smoke detectors in the home, increasing physical activity, increasing p.o.  intake of water and vegetables. -labs -reviewed immunizations -given handout -next CPE 1 year See After Visit Summary for Counseling Recommendations   Paresthesias in right hand  -Discussed possible causes including carpal tunnel syndrome -Supportive care including stretching, NSAIDs, vitamin B6, wrist brace at night, and ergonomic workspace modifications encouraged. -Prednisone taper if needed -Consider EMG/NCS for continued or worsening symptoms - Plan: TSH, T4, Free, Hemoglobin A1c, CMP, Vitamin B12  Routine screening for STI (sexually transmitted infection)  - Plan: HIV Antibody (routine testing w rflx), RPR, C. trachomatis/N. gonorrhoeae RNA  Hair loss  -Likely hereditary -We will rule out thyroid dysfunction -Discussed OTC Rogaine versus procedures - Plan: CBC with Differential/Platelet, TSH, T4, Free, Ambulatory referral to Dermatology  Screening for cholesterol level  - Plan: Lipid panel  Tinea pedis of both feet  - Plan: clotrimazole-betamethasone (LOTRISONE) cream  Exercise-induced asthma -Stable - Plan: albuterol (VENTOLIN HFA) 108 (90 Base) MCG/ACT inhaler  Follow-up in the next few months as needed  Grier Mitts, MD

## 2021-10-20 ENCOUNTER — Telehealth: Payer: Self-pay | Admitting: Family Medicine

## 2021-10-20 LAB — C. TRACHOMATIS/N. GONORRHOEAE RNA
C. trachomatis RNA, TMA: NOT DETECTED
N. gonorrhoeae RNA, TMA: NOT DETECTED

## 2021-10-20 LAB — RPR: RPR Ser Ql: NONREACTIVE

## 2021-10-20 LAB — HIV ANTIBODY (ROUTINE TESTING W REFLEX): HIV 1&2 Ab, 4th Generation: NONREACTIVE

## 2021-10-20 NOTE — Telephone Encounter (Signed)
Patient needs to schedule nurse visit for B-12 injections.

## 2021-10-20 NOTE — Telephone Encounter (Signed)
Pt see in his mychart that he needs to have b12 injections. Please put order in system

## 2021-10-27 ENCOUNTER — Ambulatory Visit (INDEPENDENT_AMBULATORY_CARE_PROVIDER_SITE_OTHER): Payer: 59

## 2021-10-27 DIAGNOSIS — E538 Deficiency of other specified B group vitamins: Secondary | ICD-10-CM | POA: Diagnosis not present

## 2021-10-27 MED ORDER — CYANOCOBALAMIN 1000 MCG/ML IJ SOLN
1000.0000 ug | Freq: Once | INTRAMUSCULAR | Status: AC
  Administered 2021-10-27: 1000 ug via INTRAMUSCULAR

## 2021-10-27 NOTE — Progress Notes (Signed)
Per orders of Dr. Caryl Never in Dr. Salomon Fick absence  , injection of B12 given by Solon Augusta. Patient tolerated injection well.

## 2021-11-24 ENCOUNTER — Ambulatory Visit (INDEPENDENT_AMBULATORY_CARE_PROVIDER_SITE_OTHER): Payer: 59

## 2021-11-24 DIAGNOSIS — E538 Deficiency of other specified B group vitamins: Secondary | ICD-10-CM | POA: Diagnosis not present

## 2021-11-24 MED ORDER — CYANOCOBALAMIN 1000 MCG/ML IJ SOLN
1000.0000 ug | Freq: Once | INTRAMUSCULAR | Status: AC
  Administered 2021-11-24: 1000 ug via INTRAMUSCULAR

## 2021-11-24 NOTE — Progress Notes (Signed)
Per orders of Billie Ruddy, MD, injection of B12 given in  left deltoid by Lataria Courser D Mahamud Metts. ?Patient tolerated injection well. ? ?Lab Results  ?Component Value Date  ? VITAMINB12 151 (L) 10/19/2021  ? ? ?  ?

## 2021-12-13 ENCOUNTER — Telehealth: Payer: Self-pay | Admitting: Family Medicine

## 2021-12-13 NOTE — Telephone Encounter (Signed)
Patient stopped by to drop off paperwork that needs to be filled out. Patient completed form and paperwork was placed in folder to be completed. ? ? ? ? ? ? ?Please advise  ?

## 2021-12-14 NOTE — Telephone Encounter (Signed)
Form placed on providers desk for completion

## 2021-12-18 NOTE — Telephone Encounter (Signed)
Called pt, left VM informing him form is complete and will be at front desk.  ?

## 2021-12-25 ENCOUNTER — Ambulatory Visit: Payer: 59

## 2021-12-26 ENCOUNTER — Ambulatory Visit (INDEPENDENT_AMBULATORY_CARE_PROVIDER_SITE_OTHER): Payer: 59 | Admitting: *Deleted

## 2021-12-26 ENCOUNTER — Ambulatory Visit: Payer: 59

## 2021-12-26 DIAGNOSIS — E538 Deficiency of other specified B group vitamins: Secondary | ICD-10-CM | POA: Diagnosis not present

## 2021-12-26 MED ORDER — CYANOCOBALAMIN 1000 MCG/ML IJ SOLN
1000.0000 ug | Freq: Once | INTRAMUSCULAR | Status: AC
  Administered 2021-12-26: 1000 ug via INTRAMUSCULAR

## 2021-12-26 NOTE — Progress Notes (Addendum)
Per orders of Dr. Jordan, injection of Cyanocobalamin 1000mcg given by Michal Strzelecki A. Patient tolerated injection well.  

## 2022-01-02 NOTE — Progress Notes (Signed)
Per orders of Dr. Jordan, injection of Cyanocobalamin 1000mcg given by Zubayr Bednarczyk A. Patient tolerated injection well.  

## 2022-01-25 ENCOUNTER — Encounter: Payer: Self-pay | Admitting: Family Medicine

## 2022-01-25 ENCOUNTER — Ambulatory Visit: Payer: 59 | Admitting: Family Medicine

## 2022-01-25 DIAGNOSIS — F321 Major depressive disorder, single episode, moderate: Secondary | ICD-10-CM

## 2022-01-25 DIAGNOSIS — F411 Generalized anxiety disorder: Secondary | ICD-10-CM

## 2022-01-25 DIAGNOSIS — K580 Irritable bowel syndrome with diarrhea: Secondary | ICD-10-CM | POA: Diagnosis not present

## 2022-01-25 MED ORDER — SERTRALINE HCL 25 MG PO TABS: 25.0000 mg | ORAL_TABLET | Freq: Every day | ORAL | 1 refills | Status: DC

## 2022-01-25 NOTE — Patient Instructions (Signed)
Behavioral Health Services: ?-to make an appointment contact the office/provider you are interested in seeing.  No referral is needed.  The below is not an all inclusive list, but will help you get started. ? ?[email or web address] ? ?[email or web address] ?-counseling located off of [address]. ? ?[facility] ?-Located off of [address]. across from [employer] ? ?Dr. Jannifer Franklin is a Psychiatrist with  [facility]. [phone] ? ?[facility] ? ? ?

## 2022-01-25 NOTE — Progress Notes (Signed)
Subjective:  ? ? Patient ID: Mark Wade, male    DOB: 12/02/1995, 26 y.o.   MRN: [id number] ? ?Chief Complaint  ?Patient presents with  ? Anxiety  ?  Is affecting work, not able to focus while at work. Has not taken the meds but will be doin so   ? ? ?HPI ?Patient was seen today for ongoing concern.  Patient endorses increased anxiety.  States it is affecting his job performance pt notes having to get up from desk and taking longer to complete task.  Patient requesting accommodations to work from home few days per week.  Pt also notes becoming short tempered with people.  Waking up some nights due to panic attacks.  Pt picked up Rx for Zoloft but never started it.  Had a counselor, but had to find a new provider.  Did not have much success at [employer].  Pt also mentions increased anxiety causes increased IBS/bowel problems, "stomach acts funny". ? ?Past Medical History:  ?Diagnosis Date  ? Asthma   ? Frequent headaches   ? Irritable bowel syndrome (IBS)   ? Migraine   ? ? ?Allergies  ?Allergen Reactions  ? Other   ?  Allergic to walnuts and pecans- throat swells up.  ? Pollen Extract Itching  ?  Watery eyes   ? ? ?ROS ?General: Denies fever, chills, night sweats, changes in weight, changes in appetite ?HEENT: Denies headaches, ear pain, changes in vision, rhinorrhea, sore throat ?CV: Denies CP, palpitations, SOB, orthopnea ?Pulm: Denies SOB, cough, wheezing ?GI: Denies abdominal pain, nausea, vomiting, diarrhea, constipation ?GU: Denies dysuria, hematuria, frequency, vaginal discharge ?Msk: Denies muscle cramps, joint pains ?Neuro: Denies weakness, numbness, tingling ?Skin: Denies rashes, bruising ?Psych: Denies depression, hallucinations +anxiety ?   ?Objective:  ?  ?Blood pressure (!) (P) 128/98, pulse (P) 83, temperature (P) 98.9 ?F (37.2 ?C), temperature source (P) Oral, weight (P) 255 lb 6.4 oz (115.8 kg), SpO2 (P) 99 %. ? ?Gen. Pleasant, well-nourished, in no distress, normal affect   ?HEENT: [place]/AT, face  symmetric, conjunctiva clear, no scleral icterus, PERRLA, EOMI, nares patent without drainage ?Lungs: no accessory muscle use ?Cardiovascular: RRR, no peripheral edema ?Musculoskeletal: No deformities, no cyanosis or clubbing, normal tone ?Neuro:  A&Ox3, CN II-XII intact, normal gait ?Skin:  Warm, no lesions/ rash ? ? ?Wt Readings from Last 3 Encounters:  ?10/19/21 252 lb 9.6 oz (114.6 kg)  ?04/26/21 247 lb 9.6 oz (112.3 kg)  ?08/04/20 222 lb 9.6 oz (101 kg)  ? ? ?Lab Results  ?Component Value Date  ? WBC 7.7 10/19/2021  ? HGB 14.8 10/19/2021  ? HCT 42.6 10/19/2021  ? PLT 212.0 10/19/2021  ? GLUCOSE 79 10/19/2021  ? CHOL 178 10/19/2021  ? TRIG 57.0 10/19/2021  ? HDL 47.90 10/19/2021  ? LDLCALC 118 (H) 10/19/2021  ? ALT 34 10/19/2021  ? AST 28 10/19/2021  ? NA 140 10/19/2021  ? K 3.9 10/19/2021  ? CL 103 10/19/2021  ? CREATININE 0.91 10/19/2021  ? BUN 14 10/19/2021  ? CO2 32 10/19/2021  ? TSH 0.43 10/19/2021  ? HGBA1C 5.8 10/19/2021  ? ? ?  01/25/2022  ?  3:53 PM 10/19/2021  ? 10:52 AM 04/26/2021  ?  1:38 PM  ?Depression screen PHQ 2/9  ?Decreased Interest 1 1 2   ?Down, Depressed, Hopeless 1 0 1  ?PHQ - 2 Score 2 1 3   ?Altered sleeping 1  3  ?Tired, decreased energy 2  1  ?Change in appetite 2  3  ?Feeling bad or failure about yourself  1  2  ?Trouble concentrating 3    ?Moving slowly or fidgety/restless 2  1  ?Suicidal thoughts 0  0  ?PHQ-9 Score 13  13  ?Difficult doing work/chores Very difficult  Extremely dIfficult  ? ? ?  01/25/2022  ?  4:09 PM 04/26/2021  ?  1:38 PM 07/06/2020  ?  9:21 PM  ?GAD 7 : Generalized Anxiety Score  ?Nervous, Anxious, on Edge 3 2 2   ?Control/stop worrying 2 3 3   ?Worry too much - different things 3 3 3   ?Trouble relaxing 3 2 3   ?Restless 3 2 2   ?Easily annoyed or irritable 3 3 2   ?Afraid - awful might happen 2 3 2   ?Total GAD 7 Score 19 18 17   ?Anxiety Difficulty Somewhat difficult Extremely difficult Very difficult  ? ? ? ? ? ?Assessment/Plan: ? ?GAD (generalized anxiety disorder)  ?-GAD-7  score 19 this visit.  Previously 18 on 04/26/2021 ?-Discussed revisiting counseling.  Patient given information on area providers.  Encouraged to set up a new appointment. ?-Discussed r/b/a of medication options.  Patient previously hesitant but willing to try Zoloft. ?-New Rx sent to pharmacy ?-Continue meditation ?-Accommodations form completed for patient who works at of as a ?- Plan: sertraline (ZOLOFT) 25 MG tablet ? ?Depression, major, single episode, moderate (HCC) ?-PHQ-9 score 13 this visit.  Previously 13 on 04/26/2021. ?-Given info on area [facility] providers to restart counseling. ?-Patient willing to try Zoloft 25 mg ?-Rx sent to pharmacy ?-Continue self-care ?-Plan: Zoloft 25 mg ? ?Irritable bowel syndrome with diarrhea ?-Low FODMAP diet ?-Continue working on self-care and ways to decrease stress ? ?F/u 4-6 weeks, sooner if needed ? ? , MD ?

## 2022-02-06 ENCOUNTER — Telehealth: Payer: Self-pay | Admitting: Family Medicine

## 2022-02-06 NOTE — Telephone Encounter (Signed)
Mark Wade with [employer] is calling and has a question about pt work place accomodation form. Mark Wade is aware md will be back in office tomorrow 02-07-2022

## 2022-02-20 NOTE — Telephone Encounter (Signed)
Spoke with Herbert Spires, stated she had rec'd the fax answering the question.

## 2022-03-07 ENCOUNTER — Ambulatory Visit: Payer: 59 | Admitting: Family Medicine

## 2022-04-12 ENCOUNTER — Ambulatory Visit: Payer: 59 | Admitting: Family Medicine

## 2022-04-16 ENCOUNTER — Ambulatory Visit: Payer: 59 | Admitting: Family Medicine

## 2022-04-16 VITALS — BP 124/86 | HR 68 | Temp 98.7°F | Wt 252.0 lb

## 2022-04-16 DIAGNOSIS — F324 Major depressive disorder, single episode, in partial remission: Secondary | ICD-10-CM | POA: Diagnosis not present

## 2022-04-16 DIAGNOSIS — Z113 Encounter for screening for infections with a predominantly sexual mode of transmission: Secondary | ICD-10-CM

## 2022-04-16 DIAGNOSIS — F411 Generalized anxiety disorder: Secondary | ICD-10-CM

## 2022-04-16 MED ORDER — SERTRALINE HCL 25 MG PO TABS: 25.0000 mg | ORAL_TABLET | Freq: Every day | ORAL | 1 refills | Status: DC

## 2022-04-16 NOTE — Progress Notes (Signed)
Subjective:    Patient ID: Mark Wade, male    DOB: August 13, 1996, 26 y.o.   MRN: [id number]  Chief Complaint  Patient presents with   Medication Refill    HPI Patient was seen today for f/u.  Pt states he has noticed a difference since starting Zoloft 25 mg.  At times patient forgets to take medicine.  Requesting refill as has 3 tabs left.  Since being on the med and working from home 3 days per wk, pt has been more productive.  He goes into the office 2 days per wk.  Pt's manager at work has also noticed the improvement.  Pt inquires if forms can be extended so that he can continue working from home.  Patient also requesting routine STI screening.  Denies symptoms.  Past Medical History:  Diagnosis Date   Asthma    Frequent headaches    Irritable bowel syndrome (IBS)    Migraine     Allergies  Allergen Reactions   Other     Allergic to walnuts and pecans- throat swells up.   Pollen Extract Itching    Watery eyes     ROS General: Denies fever, chills, night sweats, changes in weight, changes in appetite HEENT: Denies headaches, ear pain, changes in vision, rhinorrhea, sore throat CV: Denies CP, palpitations, SOB, orthopnea Pulm: Denies SOB, cough, wheezing GI: Denies abdominal pain, nausea, vomiting, diarrhea, constipation GU: Denies dysuria, hematuria, frequency Msk: Denies muscle cramps, joint pains Neuro: Denies weakness, numbness, tingling Skin: Denies rashes, bruising Psych: Denies depression, anxiety, hallucinations      Objective:    Blood pressure 124/86, pulse 68, temperature 98.7 F (37.1 C), temperature source Oral, weight 252 lb (114.3 kg), SpO2 96 %.  Gen. Pleasant, well-nourished, in no distress, normal affect   HEENT: [place]/AT, face symmetric, conjunctiva clear, no scleral icterus, PERRLA, EOMI, nares patent without drainage Lungs: no accessory muscle use Cardiovascular: RRR, no peripheral edema Neuro:  A&Ox3, CN II-XII intact, normal gait Skin:  Warm,  no lesions/ rash   Wt Readings from Last 3 Encounters:  04/16/22 252 lb (114.3 kg)  01/25/22 (P) 255 lb 6.4 oz (115.8 kg)  10/19/21 252 lb 9.6 oz (114.6 kg)    Lab Results  Component Value Date   WBC 7.7 10/19/2021   HGB 14.8 10/19/2021   HCT 42.6 10/19/2021   PLT 212.0 10/19/2021   GLUCOSE 79 10/19/2021   CHOL 178 10/19/2021   TRIG 57.0 10/19/2021   HDL 47.90 10/19/2021   LDLCALC 118 (H) 10/19/2021   ALT 34 10/19/2021   AST 28 10/19/2021   NA 140 10/19/2021   K 3.9 10/19/2021   CL 103 10/19/2021   CREATININE 0.91 10/19/2021   BUN 14 10/19/2021   CO2 32 10/19/2021   TSH 0.43 10/19/2021   HGBA1C 5.8 10/19/2021      04/16/2022    9:37 AM 01/25/2022    3:53 PM 10/19/2021   10:52 AM  Depression screen PHQ 2/9  Decreased Interest 0 1 1  Down, Depressed, Hopeless 1 1 0  PHQ - 2 Score 1 2 1   Altered sleeping 1 1   Tired, decreased energy 1 2   Change in appetite 1 2   Feeling bad or failure about yourself  0 1   Trouble concentrating 0 3   Moving slowly or fidgety/restless 0 2   Suicidal thoughts 0 0   PHQ-9 Score 4 13   Difficult doing work/chores Very difficult Very difficult  04/16/2022    9:49 AM 01/25/2022    4:09 PM 04/26/2021    1:38 PM 07/06/2020    9:21 PM  GAD 7 : Generalized Anxiety Score  Nervous, Anxious, on Edge 2 3 2 2   Control/stop worrying 1 2 3 3   Worry too much - different things 1 3 3 3   Trouble relaxing 2 3 2 3   Restless 2 3 2 2   Easily annoyed or irritable 1 3 3 2   Afraid - awful might happen 1 2 3 2   Total GAD 7 Score 10 19 18 17   Anxiety Difficulty Very difficult Somewhat difficult Extremely difficult Very difficult    Assessment/Plan:  Depression, major, single episode, in partial remission (HCC) -Improving -PHQ 9 score 4 this visit.  Was 13 on 01/25/22 -Discussed the importance of consistently taking Zoloft to avoid withdrawal symptoms -Counseling -Continue self-care - Plan: sertraline (ZOLOFT) 25 MG tablet  GAD  (generalized anxiety disorder) -Improving -GAD 7 score 10 this visit.  Was 19 on 01/25/22 -Discussed taking Zoloft consistently -Counseling -Continue self-care  - Plan: sertraline (ZOLOFT) 25 MG tablet  Routine screening for STI (sexually transmitted infection)  - Plan: RPR, HIV Antibody (routine testing w rflx), C. trachomatis/N. gonorrhoeae RNA, sertraline (ZOLOFT) 25 MG tablet  F/u in 3 months, sooner if needed  , MD

## 2022-04-17 LAB — C. TRACHOMATIS/N. GONORRHOEAE RNA
C. trachomatis RNA, TMA: NOT DETECTED
N. gonorrhoeae RNA, TMA: NOT DETECTED

## 2022-04-17 LAB — HIV ANTIBODY (ROUTINE TESTING W REFLEX): HIV 1&2 Ab, 4th Generation: NONREACTIVE

## 2022-04-17 LAB — RPR: RPR Ser Ql: NONREACTIVE

## 2022-08-01 ENCOUNTER — Ambulatory Visit (INDEPENDENT_AMBULATORY_CARE_PROVIDER_SITE_OTHER): Payer: 59 | Admitting: Family Medicine

## 2022-08-01 DIAGNOSIS — F324 Major depressive disorder, single episode, in partial remission: Secondary | ICD-10-CM | POA: Diagnosis not present

## 2022-08-01 DIAGNOSIS — F411 Generalized anxiety disorder: Secondary | ICD-10-CM | POA: Diagnosis not present

## 2022-08-01 MED ORDER — SERTRALINE HCL 50 MG PO TABS: 50.0000 mg | ORAL_TABLET | Freq: Every day | ORAL | 3 refills | Status: DC

## 2022-08-01 NOTE — Patient Instructions (Addendum)
A refill of Zoloft was sent to your pharmacy.  You can take half a tab (25 mg) for a wk, then increase it to a whole tab (50 mg) daily.

## 2022-08-01 NOTE — Progress Notes (Signed)
Subjective:    Patient ID: Mark Wade, male    DOB: July 31, 1996, 26 y.o.   MRN: [id number]  Chief Complaint  Patient presents with   Anxiety    Tried to go back in office to work but is not ale to focus. Was told he doing more work while he is not in the office. Needs to update work forms to work at home.     HPI Patient was seen today for f/u on anxiety.  Pt states he was doing well while working from home, but forms have expired.  Was advised by his boss to have them redone.  When in office notes increased anxiety, inability to concentrate and complete tasks efficiently, increased IBS symptoms.  Noticed some improvement in symptoms with taking zoloft consistently, though, has been out of med.  Denies insomnia, decreased appetite, changes in energy.  Past Medical History:  Diagnosis Date   Asthma    Frequent headaches    Irritable bowel syndrome (IBS)    Migraine     Allergies  Allergen Reactions   Other     Allergic to walnuts and pecans- throat swells up.   Pollen Extract Itching    Watery eyes     ROS General: Denies fever, chills, night sweats, changes in weight, changes in appetite HEENT: Denies headaches, ear pain, changes in vision, rhinorrhea, sore throat CV: Denies CP, palpitations, SOB, orthopnea Pulm: Denies SOB, cough, wheezing GI: Denies abdominal pain, nausea, vomiting, diarrhea, constipation GU: Denies dysuria, hematuria, frequency, vaginal discharge Msk: Denies muscle cramps, joint pains Neuro: Denies weakness, numbness, tingling Skin: Denies rashes, bruising Psych: Denies hallucinations  +anxiety, depression    Objective:    Blood pressure 132/82, pulse 62, temperature 99.2 F (37.3 C), temperature source Oral, weight 247 lb (112 kg), SpO2 98 %.  Gen. Pleasant, well-nourished, in no distress, normal affect   HEENT: [place]/AT, face symmetric, conjunctiva clear, no scleral icterus, PERRLA, EOMI, nares patent without drainage Lungs: no accessory muscle use,  CTAB, no wheezes or rales Cardiovascular: RRR, no m/r/g, no peripheral edema Neuro:  A&Ox3, CN II-XII intact, normal gait  Wt Readings from Last 3 Encounters:  08/01/22 247 lb (112 kg)  04/16/22 252 lb (114.3 kg)  01/25/22 (P) 255 lb 6.4 oz (115.8 kg)    Lab Results  Component Value Date   WBC 7.7 10/19/2021   HGB 14.8 10/19/2021   HCT 42.6 10/19/2021   PLT 212.0 10/19/2021   GLUCOSE 79 10/19/2021   CHOL 178 10/19/2021   TRIG 57.0 10/19/2021   HDL 47.90 10/19/2021   LDLCALC 118 (H) 10/19/2021   ALT 34 10/19/2021   AST 28 10/19/2021   NA 140 10/19/2021   K 3.9 10/19/2021   CL 103 10/19/2021   CREATININE 0.91 10/19/2021   BUN 14 10/19/2021   CO2 32 10/19/2021   TSH 0.43 10/19/2021   HGBA1C 5.8 10/19/2021      08/01/2022    3:31 PM 04/16/2022    9:37 AM 01/25/2022    3:53 PM  Depression screen PHQ 2/9  Decreased Interest 1 0 1  Down, Depressed, Hopeless 1 1 1   PHQ - 2 Score 2 1 2   Altered sleeping 0 1 1  Tired, decreased energy 1 1 2   Change in appetite 1 1 2   Feeling bad or failure about yourself  1 0 1  Trouble concentrating 2 0 3  Moving slowly or fidgety/restless 3 0 2  Suicidal thoughts 0 0 0  PHQ-9 Score 10  4 13  Difficult doing work/chores Very difficult Very difficult Very difficult      08/01/2022    3:35 PM 04/16/2022    9:49 AM 01/25/2022    4:09 PM 04/26/2021    1:38 PM  GAD 7 : Generalized Anxiety Score  Nervous, Anxious, on Edge 2 2 3 2   Control/stop worrying 3 1 2 3   Worry too much - different things 3 1 3 3   Trouble relaxing 2 2 3 2   Restless 3 2 3 2   Easily annoyed or irritable 3 1 3 3   Afraid - awful might happen 0 1 2 3   Total GAD 7 Score 16 10 19 18   Anxiety Difficulty Very difficult Very difficult Somewhat difficult Extremely difficult   Assessment/Plan:  GAD (generalized anxiety disorder) -GAD-7 score 16 this visit previously 10 on 04/16/2022 -Zoloft refilled.  Patient to start out with 25 mg daily x 1 week then increase to 50 mg  daily. -Given information about area [facility] providers and encouraged to restart counseling -Given strict precautions -Accommodation forms completed for patient's job.  - Plan: sertraline (ZOLOFT) 50 MG tablet  Depression, major, single episode, in partial remission (HCC) -PHQ-9 score 10 this visit.  Previously for on 04/16/2022 -Discussed the importance of self-care -Restart Zoloft at 25 mg then increase to 50 mg after 1 week - Plan: sertraline (ZOLOFT) 50 MG tablet  F/u in 4-8 weeks  , MD

## 2022-08-19 ENCOUNTER — Encounter: Payer: Self-pay | Admitting: Family Medicine

## 2023-05-17 ENCOUNTER — Ambulatory Visit: Payer: 59 | Admitting: Family Medicine

## 2023-05-22 ENCOUNTER — Ambulatory Visit: Payer: 59 | Admitting: Family Medicine

## 2023-05-22 ENCOUNTER — Encounter: Payer: Self-pay | Admitting: Family Medicine

## 2023-05-22 VITALS — BP 110/80 | HR 72 | Temp 98.7°F | Ht 74.0 in | Wt 248.2 lb

## 2023-05-22 DIAGNOSIS — R0982 Postnasal drip: Secondary | ICD-10-CM

## 2023-05-22 DIAGNOSIS — J4521 Mild intermittent asthma with (acute) exacerbation: Secondary | ICD-10-CM

## 2023-05-22 DIAGNOSIS — M7062 Trochanteric bursitis, left hip: Secondary | ICD-10-CM

## 2023-05-22 DIAGNOSIS — R051 Acute cough: Secondary | ICD-10-CM

## 2023-05-22 DIAGNOSIS — M545 Low back pain, unspecified: Secondary | ICD-10-CM

## 2023-05-22 LAB — POC COVID19 BINAXNOW: SARS Coronavirus 2 Ag: NEGATIVE

## 2023-05-22 MED ORDER — PREDNISONE 10 MG PO TABS: ORAL_TABLET | ORAL | 0 refills | Status: DC

## 2023-05-22 MED ORDER — CYCLOBENZAPRINE HCL 5 MG PO TABS: 5.0000 mg | ORAL_TABLET | Freq: Every evening | ORAL | 0 refills | Status: AC | PRN

## 2023-05-22 MED ORDER — IBUPROFEN 800 MG PO TABS: 800.0000 mg | ORAL_TABLET | Freq: Three times a day (TID) | ORAL | 0 refills | Status: AC | PRN

## 2023-05-22 MED ORDER — ALBUTEROL SULFATE HFA 108 (90 BASE) MCG/ACT IN AERS: 2.0000 | INHALATION_SPRAY | Freq: Four times a day (QID) | RESPIRATORY_TRACT | 5 refills | Status: AC | PRN

## 2023-05-22 MED ORDER — BENZONATATE 100 MG PO CAPS: 100.0000 mg | ORAL_CAPSULE | Freq: Two times a day (BID) | ORAL | 0 refills | Status: AC | PRN

## 2023-05-30 ENCOUNTER — Ambulatory Visit: Payer: 59 | Admitting: Family Medicine

## 2023-05-30 ENCOUNTER — Encounter: Payer: Self-pay | Admitting: Family Medicine

## 2023-05-30 VITALS — BP 133/85 | HR 71 | Temp 97.3°F | Ht 74.0 in | Wt 249.0 lb

## 2023-05-30 DIAGNOSIS — J302 Other seasonal allergic rhinitis: Secondary | ICD-10-CM

## 2023-05-30 DIAGNOSIS — R059 Cough, unspecified: Secondary | ICD-10-CM

## 2023-05-30 DIAGNOSIS — J4521 Mild intermittent asthma with (acute) exacerbation: Secondary | ICD-10-CM | POA: Diagnosis not present

## 2023-05-30 MED ORDER — PREDNISONE 50 MG PO TABS: ORAL_TABLET | ORAL | 0 refills | Status: DC

## 2023-05-30 MED ORDER — AZITHROMYCIN 250 MG PO TABS: ORAL_TABLET | ORAL | 0 refills | Status: DC

## 2023-05-30 NOTE — Progress Notes (Signed)
Established Patient Office Visit   Subjective  Patient ID: Mark Wade, male    DOB: 04-03-1996  Age: 27 y.o. MRN: [id number]  Chief Complaint  Patient presents with   back pain     Patient has been having back and hip pain, patient also has a cough that started 2 days ago    Pt is a 27 yo male seen for acute concerns.  Pt endorses midline and left sided low back pain times months.  Also notes left hip pain times months.  Pain is sharp, at times severe pain with numbness.  May last 30 minutes.  Patient notes increased symptoms with going up stairs and getting out of car.  Patient also notes acute cough x 2 days with occasional wheezing.  Patient requesting refill on inhaler.  Denies fever, chills, ear pain/ear pressure, facial pain/facial pressure, sore throat.    Past Medical History:  Diagnosis Date   Asthma    Frequent headaches    Irritable bowel syndrome (IBS)    Migraine    Past Surgical History:  Procedure Laterality Date   ESOPHAGOGASTRODUODENOSCOPY ENDOSCOPY     Social History   Tobacco Use   Smoking status: Some Days    Types: Cigarettes   Smokeless tobacco: Never  Substance Use Topics   Alcohol use: No   Drug use: Yes    Types: Marijuana   History reviewed. No pertinent family history. Allergies  Allergen Reactions   Other     Allergic to walnuts and pecans- throat swells up.   Pollen Extract Itching    Watery eyes       ROS Negative unless stated above    Objective:     BP 110/80 (BP Location: Left Arm, Patient Position: Sitting, Cuff Size: Large)   Pulse 72   Temp 98.7 F (37.1 C) (Oral)   Ht 6\' 2"  (1.88 m)   Wt 248 lb 3.2 oz (112.6 kg)   SpO2 97%   BMI 31.87 kg/m  BP Readings from Last 3 Encounters:  05/30/23 133/85  05/22/23 110/80  08/01/22 132/82   Wt Readings from Last 3 Encounters:  05/30/23 249 lb (112.9 kg)  05/22/23 248 lb 3.2 oz (112.6 kg)  08/01/22 247 lb (112 kg)      Physical Exam Constitutional:       General: He is not in acute distress.    Appearance: Normal appearance.  HENT:     Head: Normocephalic and atraumatic.     Nose: Nose normal.     Mouth/Throat:     Mouth: Mucous membranes are moist.     Pharynx: Postnasal drip present.  Cardiovascular:     Rate and Rhythm: Normal rate and regular rhythm.     Heart sounds: Normal heart sounds. No murmur heard.    No gallop.  Pulmonary:     Effort: Pulmonary effort is normal. No respiratory distress.     Breath sounds: Normal breath sounds. No wheezing, rhonchi or rales.     Comments: Cough. Musculoskeletal:     Comments: No TTP to cervical  or thoracic spine.  TTP of lumbar spine and L lumbar paraspinal muscle.  No TTP of sciatic nerve.  TTP of L lateral hip at trochanteric bursa.  Skin:    General: Skin is warm and dry.  Neurological:     Mental Status: He is alert and oriented to person, place, and time.      Results for orders placed or performed in visit on  05/22/23  POC COVID-19 BinaxNow  Result Value Ref Range   SARS Coronavirus 2 Ag Negative Negative      Assessment & Plan:  Acute cough -COVID testing negative -Symptoms likely 2/2 postnasal drainage -Discussed supportive care including OTC cough/cold medications, warm fluids, honey, etc. -Rx for Tessalon sent to pharmacy -     POC COVID-19 BinaxNow -     Benzonatate; Take 1 capsule (100 mg total) by mouth 2 (two) times daily as needed for cough.  Dispense: 20 capsule; Refill: 0  Trochanteric bursitis of left hip -NSAIDs or Tylenol as needed -Prednisone taper -     Ibuprofen; Take 1 tablet (800 mg total) by mouth every 8 (eight) hours as needed.  Dispense: 30 tablet; Refill: 0       -      Prednisone 10 mg tablet  Post-nasal drainage -Due to allergies versus viral etiology.  Likely causing increased cough and wheezing -OTC p.o. antihistamine and Flonase.  Mild intermittent asthma with exacerbation -     Albuterol Sulfate HFA; Inhale 2 puffs into the lungs  every 6 (six) hours as needed for wheezing or shortness of breath.  Dispense: 8 g; Refill: 5       -      Prednisone 10 mg tablet  Acute left-sided low back pain without sciatica - -     Cyclobenzaprine HCl; Take 1 tablet (5 mg total) by mouth at bedtime as needed.  Dispense: 30 tablet; Refill: 0 -     Ibuprofen; Take 1 tablet (800 mg total) by mouth every 8 (eight) hours as needed.  Dispense: 30 tablet; Refill: 0       -      Prednisone 10 mg tablet   Return if symptoms worsen or fail to improve.   Deeann Saint, MD

## 2023-05-30 NOTE — Patient Instructions (Signed)
It was very nice to see you today!  I think you may have bronchitis or pneumonia.  Please start the prednisone and azithromycin.  You can continue using albuterol.  Let us know if not improving by next week.  Return if symptoms worsen or fail to improve.   Take care, Dr Jimmey Ralph  PLEASE NOTE:  If you had any lab tests, please let us know if you have not heard back within a few days. You may see your results on mychart before we have a chance to review them but we will give you a call once they are reviewed by [place].   If we ordered any referrals today, please let us know if you have not heard from their office within the next week.   If you had any urgent prescriptions sent in today, please check with the pharmacy within an hour of our visit to make sure the prescription was transmitted appropriately.   Please try these tips to maintain a healthy lifestyle:  Eat at least 3 REAL meals and 1-2 snacks per day.  Aim for no more than 5 hours between eating.  If you eat breakfast, please do so within one hour of getting up.   Each meal should contain half fruits/vegetables, one quarter protein, and one quarter carbs (no bigger than a computer mouse)  Cut down on sweet beverages. This includes juice, soda, and sweet tea.   Drink at least 1 glass of water with each meal and aim for at least 8 glasses per day  Exercise at least 150 minutes every week.

## 2023-05-30 NOTE — Progress Notes (Signed)
   Mark Wade is a 27 y.o. male who presents today for an office visit.  Assessment/Plan:  New/Acute Problems: Cough Mild rhonchi and rales on exam but otherwise reassuring respiratory exam without any signs of respiratory distress.  Concern for possible bacterial infection given that symptoms been going on for 3 weeks at this point.  Will start azithromycin and prednisone.  We did discuss chest x-ray however given that there would not significantly change management this point we will hold off on this for now.  He can continue taking albuterol every 4-6 hours as needed.  Encouraged hydration.  He will follow-up with [place] next week if not improving.  Need to get chest x-ray at that point.  Chronic Problems Addressed Today: Asthma Dealing with acute flare as above.  Will give prednisone burst 50 mg daily x 5 days.  He can continue taking albuterol.  If not improving with this regimen would consider addition of inhaled controller medication such as Dulera or Symbicort.  Seasonal Allergies  Worsened recently as well.  Likely contributing to above.  He can continue taking over-the-counter allergy medicines.  Should improve with prednisone.       Subjective:  HPI:  See Assessment / plan for status of chronic conditions.  Patient is here with cough and shortness of breath.  He was seen by his PCP 8 days ago for this as well and was given prescriptions for Tessalon and prednisone taper for predisone   He is not sure if it helped.  He has had asthma his whole life.  Typically well-controlled with albuterol though occasionally has flares.  Cough is not doing well for about 3 weeks at this point.  Not sure if it is changed months last week or so.  His children have been sick with similar symptoms.  No chest pain.  No forted fevers or chills.  Albuterol does help however tries not to use it too much.       Objective:  Physical Exam: BP 133/85   Pulse 71   Temp (!) 97.3 F (36.3 C) (Temporal)    Ht 6\' 2"  (1.88 m)   Wt 249 lb (112.9 kg)   SpO2 99%   BMI 31.97 kg/m   Gen: No acute distress, resting comfortably CV: Regular rate and rhythm with no murmurs appreciated Pulm: Normal work of breathing, speaking in full sentences.  Diffuse rhonchi noted with rales at left lower base. Neuro: Grossly normal, moves all extremities Psych: Normal affect and thought content      Brekyn Huntoon M. Jimmey Ralph, MD 05/30/2023 1:54 PM

## 2023-07-17 ENCOUNTER — Ambulatory Visit: Payer: 59 | Admitting: Family Medicine

## 2023-07-17 ENCOUNTER — Encounter: Payer: Self-pay | Admitting: Family Medicine

## 2023-07-17 VITALS — BP 110/90 | HR 68 | Temp 98.3°F | Ht 74.0 in | Wt 261.2 lb

## 2023-07-17 DIAGNOSIS — Z029 Encounter for administrative examinations, unspecified: Secondary | ICD-10-CM | POA: Diagnosis not present

## 2023-07-17 DIAGNOSIS — Z113 Encounter for screening for infections with a predominantly sexual mode of transmission: Secondary | ICD-10-CM | POA: Diagnosis not present

## 2023-07-17 DIAGNOSIS — F411 Generalized anxiety disorder: Secondary | ICD-10-CM | POA: Diagnosis not present

## 2023-07-17 NOTE — Progress Notes (Signed)
Established Patient Office Visit   Subjective  Patient ID: Mark Wade, male    DOB: [birth year]  Age: 27 y.o. MRN: [id number]  Chief Complaint  Patient presents with   Medical Management of Chronic Issues    Patient is a 27 year old male seen for follow-up.  Patient requesting completion of forms for work from home accommodations for his job at [employer] [place].  Patient previously had accommodations due to anxiety and depression.  Goes into office 1 day/week.  Patient states he feels like his mood and energy have been slightly decreased over the last month or so.  Patient states he can improve his water intake.  Not going to the gym regularly as he has in the past.  Patient also notes changes in sleep.  Unsure if upcoming move is contributing.  Does not feel like he needs to restart medication yet.  Patient plans to restart school and finish a degree in HR.  Also looking for new employment opportunities.  Patient requesting routine STI testing.  Denies current symptoms.    Patient Active Problem List   Diagnosis Date Noted   Anxiety 07/06/2020   Depression, major, single episode, moderate (HCC) 07/06/2020   Marijuana use 07/06/2020   Onychomycosis 04/09/2019   Nicotine dependence, cigarettes, uncomplicated 03/06/2017   Athlete's foot 08/10/2015   Vitamin D deficiency 08/10/2015   Past Medical History:  Diagnosis Date   Asthma    Frequent headaches    Irritable bowel syndrome (IBS)    Migraine    Past Surgical History:  Procedure Laterality Date   ESOPHAGOGASTRODUODENOSCOPY ENDOSCOPY     Social History   Tobacco Use   Smoking status: Some Days    Types: Cigarettes   Smokeless tobacco: Never  Substance Use Topics   Alcohol use: No   Drug use: Yes    Types: Marijuana   History reviewed. No pertinent family history. Allergies  Allergen Reactions   Other     Allergic to walnuts and pecans- throat swells up.   Pollen Extract Itching    Watery eyes        ROS Negative unless stated above    Objective:     BP (!) 110/90 (BP Location: Left Arm, Patient Position: Sitting, Cuff Size: Large)   Pulse 68   Temp 98.3 F (36.8 C) (Oral)   Ht 6\' 2"  (1.88 m)   Wt 261 lb 3.2 oz (118.5 kg)   SpO2 97%   BMI 33.54 kg/m  BP Readings from Last 3 Encounters:  07/17/23 (!) 110/90  05/30/23 133/85  05/22/23 110/80   Wt Readings from Last 3 Encounters:  07/17/23 261 lb 3.2 oz (118.5 kg)  05/30/23 249 lb (112.9 kg)  05/22/23 248 lb 3.2 oz (112.6 kg)      Physical Exam Constitutional:      Appearance: Normal appearance.  HENT:     Head: Normocephalic and atraumatic.     Nose: Nose normal.  Eyes:     Extraocular Movements: Extraocular movements intact.     Conjunctiva/sclera: Conjunctivae normal.     Pupils: Pupils are equal, round, and reactive to light.  Cardiovascular:     Rate and Rhythm: Normal rate and regular rhythm.  Pulmonary:     Effort: Pulmonary effort is normal.  Neurological:     Mental Status: He is alert and oriented to person, place, and time. Mental status is at baseline.  Psychiatric:        Attention and Perception: Attention normal.  Speech: Speech normal.        Behavior: Behavior normal. Behavior is cooperative.        Thought Content: Thought content normal.     Comments: Mildly depressed mood.      No results found for any visits on 07/17/23.    07/17/2023    8:50 AM 05/22/2023   11:59 AM 08/01/2022    3:35 PM 04/16/2022    9:49 AM  GAD 7 : Generalized Anxiety Score  Nervous, Anxious, on Edge 1 1 2 2   Control/stop worrying 1 0 3 1  Worry too much - different things 1 1 3 1   Trouble relaxing 1 1 2 2   Restless 1 1 3 2   Easily annoyed or irritable 1 1 3 1   Afraid - awful might happen 1 1 0 1  Total GAD 7 Score 7 6 16 10   Anxiety Difficulty Very difficult Very difficult Very difficult Very difficult       07/17/2023    8:50 AM 05/30/2023    1:36 PM 05/22/2023   11:58 AM  Depression screen  PHQ 2/9  Decreased Interest 1 0 1  Down, Depressed, Hopeless  0 1  PHQ - 2 Score 1 0 2  Altered sleeping 1 0 0  Tired, decreased energy 1 0 1  Change in appetite 1 0 0  Feeling bad or failure about yourself  1 0 0  Trouble concentrating 1 0 1  Moving slowly or fidgety/restless 0 0 0  Suicidal thoughts 0 0 0  PHQ-9 Score 6 0 4  Difficult doing work/chores Very difficult Not difficult at all Very difficult      Assessment & Plan:  GAD (generalized anxiety disorder) -GAD-7 score 7 this visit -PHQ-9 score 6 this visit -Slight increase in anxiety and depression symptoms.  Discussed deep breathing, self-care, other ways to help improve symptoms. -For continued or worsening symptoms consider counseling and recent medication. -Continue to monitor  Routine screening for STI (sexually transmitted infection) -     C. trachomatis/N. gonorrhoeae RNA -     RPR -     HIV Antibody (routine testing w rflx)  Administrative encounter -Accommodations form for [employer] to be completed and faxed to employer.  Patient declines influenza vaccine at this visit.  Return in about 6 weeks (around 08/28/2023), or if symptoms worsen or fail to improve.  Sooner if needed.  Consider scheduling CPE.  Deeann Saint, MD

## 2023-07-18 ENCOUNTER — Telehealth: Payer: Self-pay | Admitting: Family Medicine

## 2023-07-18 LAB — C. TRACHOMATIS/N. GONORRHOEAE RNA
C. trachomatis RNA, TMA: NOT DETECTED
N. gonorrhoeae RNA, TMA: NOT DETECTED

## 2023-07-18 LAB — HIV ANTIBODY (ROUTINE TESTING W REFLEX): HIV 1&2 Ab, 4th Generation: NONREACTIVE

## 2023-07-18 LAB — RPR: RPR Ser Ql: NONREACTIVE

## 2023-07-18 NOTE — Telephone Encounter (Signed)
Pt is calling and would like blood work results 

## 2023-07-19 NOTE — Telephone Encounter (Signed)
Message has been sent to patient by provider and seen in my chart by patient

## 2023-07-24 ENCOUNTER — Telehealth: Payer: Self-pay

## 2023-07-24 NOTE — Telephone Encounter (Signed)
Called and spoke with patient form has been filled out and faxed 07/24/23, and is in the front office for pick up

## 2023-08-01 ENCOUNTER — Ambulatory Visit: Payer: 59 | Admitting: Family Medicine

## 2023-08-01 ENCOUNTER — Encounter: Payer: Self-pay | Admitting: Family Medicine

## 2023-08-01 VITALS — BP 122/80 | HR 92 | Temp 98.4°F | Ht 74.0 in | Wt 261.0 lb

## 2023-08-01 DIAGNOSIS — F5102 Adjustment insomnia: Secondary | ICD-10-CM | POA: Diagnosis not present

## 2023-08-01 DIAGNOSIS — F4310 Post-traumatic stress disorder, unspecified: Secondary | ICD-10-CM | POA: Diagnosis not present

## 2023-08-01 DIAGNOSIS — F411 Generalized anxiety disorder: Secondary | ICD-10-CM | POA: Diagnosis not present

## 2023-08-01 MED ORDER — HYDROXYZINE HCL 25 MG PO TABS: 25.0000 mg | ORAL_TABLET | Freq: Three times a day (TID) | ORAL | 1 refills | Status: AC | PRN
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
## 2023-08-01 NOTE — Patient Instructions (Signed)
A prescription for hydroxyzine 25 mg was sent to your pharmacy.  On the bottle it says you can take up to 3 times a day as needed for anxiety.  This medication can also help with sleep.  Try taking it at night to see how it makes you feel.  If it causes drowsiness and you needed it during the day for anxiety you can try taking half a tab.  Continue employee assistance program at your job.

## 2023-08-19 ENCOUNTER — Other Ambulatory Visit: Payer: 59

## 2023-08-19 ENCOUNTER — Other Ambulatory Visit: Payer: Self-pay

## 2023-08-19 DIAGNOSIS — Z113 Encounter for screening for infections with a predominantly sexual mode of transmission: Secondary | ICD-10-CM

## 2023-08-19 NOTE — Addendum Note (Signed)
Addended by: Philipp Deputy A on: 08/19/2023 02:28 PM   Modules accepted: Orders

## 2023-08-19 NOTE — Addendum Note (Signed)
Addended by: Philipp Deputy A on: 08/19/2023 02:00 PM   Modules accepted: Orders

## 2023-08-20 LAB — RPR: RPR Ser Ql: NONREACTIVE

## 2023-08-20 LAB — HIV ANTIBODY (ROUTINE TESTING W REFLEX): HIV 1&2 Ab, 4th Generation: NONREACTIVE

## 2023-08-21 LAB — GC/CHLAMYDIA PROBE AMP
Chlamydia trachomatis, NAA: NEGATIVE
Neisseria Gonorrhoeae by PCR: NEGATIVE

## 2023-11-07 ENCOUNTER — Ambulatory Visit: Payer: Self-pay | Admitting: Family Medicine

## 2023-11-07 NOTE — Telephone Encounter (Signed)
Chief Complaint: anxiety Symptoms: mild SOB and increased HR during attack  Frequency: comes and goes   Disposition: [] ED /[] Urgent Care (no appt availability in office) / [x] Appointment(In office/virtual)/ []  [place] [facility]/ [] Home Care/ [] Refused Recommended Disposition /[]  Mobile Bus/ []  Follow-up with PCP Additional Notes: Pt calling with history of anxiety, but feels it is unmanageable. Pt states he feels overwhelmed at work and recently had death in family.  Pt feels he is worrying more, has loss of motivation, has some anger, and diet is not typical. Pt stated he is drinking more alcohol daily. Pt stated " at least a couple drinks a day." Pt has kids that lives with him roughly 50% of the time. Pt rated anxiety a 6-7. Per protocol, pt to be seen within 3 days. Pt has appt 2/21 @ 1540 with PCP. RN gave care advice and pt verbalized understanding.             Copied from CRM [id number]. Topic: Clinical - Red Word Triage >> Nov 07, 2023  8:42 AM Theodis Sato wrote: Patient is having worsening anxiety. Reason for Disposition  MODERATE anxiety (e.g., persistent or frequent anxiety symptoms; interferes with sleep, school, or work)  Answer Assessment - Initial Assessment Questions 1. CONCERN: "Did anything happen that prompted you to call today?"      Overwhelmed with work, death of family member  2. ANXIETY SYMPTOMS: "Can you describe how you (your loved one; patient) have been feeling?" (e.g., tense, restless, panicky, anxious, keyed up, overwhelmed, sense of impending doom).      Can't sleep much, loss appetite; anxious, worry  3. ONSET: "How long have you been feeling this way?" (e.g., hours, days, weeks)     Building up over 2-3 weeks  4. SEVERITY: "How would you rate the level of anxiety?" (e.g., 0 - 10; or mild, moderate, severe).     6-7 5. FUNCTIONAL IMPAIRMENT: "How have these feelings affected your ability to do daily activities?" "Have you had more  difficulty than usual doing your normal daily activities?" (e.g., getting better, same, worse; self-care, school, work, interactions)     Yes, not performing at normal  6. HISTORY: "Have you felt this way before?" "Have you ever been diagnosed with an anxiety problem in the past?" (e.g., generalized anxiety disorder, panic attacks, PTSD). If Yes, ask: "How was this problem treated?" (e.g., medicines, counseling, etc.)     Therapy and took month off work  7. RISK OF HARM - SUICIDAL IDEATION: "Do you ever have thoughts of hurting or killing yourself?" If Yes, ask:  "Do you have these feelings now?" "Do you have a plan on how you would do this?"     no 8. TREATMENT:  "What has been done so far to treat this anxiety?" (e.g., medicines, relaxation strategies). "What has helped?"     Drinking alcohol more 9. TREATMENT - THERAPIST: "Do you have a counselor or therapist? Name?"     In past, not now  10. POTENTIAL TRIGGERS: "Do you drink caffeinated beverages (e.g., coffee, colas, teas), and how much daily?" "Do you drink alcohol or use any drugs?" "Have you started any new medicines recently?"       At least a couple of drinks a day  11. PATIENT SUPPORT: "Who is with you now?" "Who do you live with?" "Do you have family or friends who you can talk to?"        Kids live part time  47. OTHER SYMPTOMS: "Do  you have any other symptoms?" (e.g., feeling depressed, trouble concentrating, trouble sleeping, trouble breathing, palpitations or fast heartbeat, chest pain, sweating, nausea, or diarrhea)       Trouble sleeping, some SOB but has asthma, fast heartbeat  Protocols used: Anxiety and Panic Attack-A-AH

## 2023-11-08 ENCOUNTER — Ambulatory Visit: Payer: 59 | Admitting: Family Medicine

## 2023-11-13 ENCOUNTER — Ambulatory Visit: Payer: 59 | Admitting: Family Medicine

## 2023-11-25 ENCOUNTER — Encounter: Admitting: Family Medicine

## 2023-11-28 ENCOUNTER — Encounter: Admitting: Family Medicine

## 2023-12-18 ENCOUNTER — Encounter (HOSPITAL_COMMUNITY): Payer: Self-pay | Admitting: Emergency Medicine

## 2023-12-18 ENCOUNTER — Emergency Department (HOSPITAL_COMMUNITY)
Admission: EM | Admit: 2023-12-18 | Discharge: 2023-12-18 | Disposition: A | Discharge status: Home / Self Care | Attending: Emergency Medicine | Admitting: Emergency Medicine

## 2023-12-18 ENCOUNTER — Emergency Department (HOSPITAL_COMMUNITY)

## 2023-12-18 ENCOUNTER — Ambulatory Visit: Payer: Self-pay

## 2023-12-18 ENCOUNTER — Other Ambulatory Visit: Payer: Self-pay

## 2023-12-18 DIAGNOSIS — R0789 Other chest pain: Secondary | ICD-10-CM | POA: Diagnosis present

## 2023-12-18 LAB — CBC
HCT: 41 % (ref 39.0–52.0)
Hemoglobin: 14.4 g/dL (ref 13.0–17.0)
MCH: 31 pg (ref 26.0–34.0)
MCHC: 35.1 g/dL (ref 30.0–36.0)
MCV: 88.2 fL (ref 80.0–100.0)
Platelets: 213 10*3/uL (ref 150–400)
RBC: 4.65 MIL/uL (ref 4.22–5.81)
RDW: 12.8 % (ref 11.5–15.5)
WBC: 7.5 10*3/uL (ref 4.0–10.5)
nRBC: 0 % (ref 0.0–0.2)

## 2023-12-18 LAB — BASIC METABOLIC PANEL WITH GFR
Anion gap: 7 (ref 5–15)
BUN: 16 mg/dL (ref 6–20)
CO2: 30 mmol/L (ref 22–32)
Calcium: 9.5 mg/dL (ref 8.9–10.3)
Chloride: 103 mmol/L (ref 98–111)
Creatinine, Ser: 1.04 mg/dL (ref 0.61–1.24)
GFR, Estimated: >60 mL/min (ref >60)
Glucose, Bld: 87 mg/dL (ref 70–99)
Potassium: 3.6 mmol/L (ref 3.5–5.1)
Sodium: 140 mmol/L (ref 135–145)

## 2023-12-18 LAB — TROPONIN I (HIGH SENSITIVITY): Troponin I (High Sensitivity): 2 ng/L (ref <18)

## 2023-12-18 MED ORDER — IBUPROFEN 200 MG PO TABS
600.0000 mg | ORAL_TABLET | Freq: Once | ORAL | Status: AC
  Administered 2023-12-18: 600 mg via ORAL
  Filled 2023-12-18: qty 3

## 2023-12-18 NOTE — ED Provider Notes (Signed)
 [place] EMERGENCY DEPARTMENT AT [facility] Provider Note   CSN: [id number] Arrival date & time: 12/18/23  1411     History  Chief Complaint  Patient presents with   Chest Pain    Mark Wade is a 28 y.o. male.  28 year old male with prior medical history as detailed below presents for evaluation.  Patient complains of persistent intermittent left-sided anterior chest wall pain.  Patient's pain is constantly present.  Patient's pain is worse with movement.  Patient's pain is worse with deep breaths.  The history is provided by the patient.       Home Medications Prior to Admission medications   Medication Sig Start Date End Date Taking? Authorizing Provider  albuterol (VENTOLIN HFA) 108 (90 Base) MCG/ACT inhaler Inhale 2 puffs into the lungs every 6 (six) hours as needed for wheezing or shortness of breath. 05/22/23   Deeann Saint, MD  benzonatate (TESSALON) 100 MG capsule Take 1 capsule (100 mg total) by mouth 2 (two) times daily as needed for cough. 05/22/23   Deeann Saint, MD  cyclobenzaprine (FLEXERIL) 5 MG tablet Take 1 tablet (5 mg total) by mouth at bedtime as needed. 05/22/23   Deeann Saint, MD  hydrOXYzine (ATARAX) 25 MG tablet Take 1 tablet (25 mg total) by mouth 3 (three) times daily as needed for anxiety. 08/01/23   Deeann Saint, MD  ibuprofen (ADVIL) 800 MG tablet Take 1 tablet (800 mg total) by mouth every 8 (eight) hours as needed. 05/22/23   Deeann Saint, MD  minoxidil (LONITEN) 2.5 MG tablet Take 1.25 mg by mouth 2 (two) times daily. 01/27/23   [provider]      Allergies    Other and Pollen extract    Review of Systems   Review of Systems  All other systems reviewed and are negative.   Physical Exam Updated Vital Signs BP (!) 153/86 (BP Location: Left Arm)   Pulse 66   Temp 98.4 F (36.9 C) (Oral)   Resp 18   SpO2 98%  Physical Exam Vitals and nursing note reviewed.  Constitutional:      General: He is not  in acute distress.    Appearance: Normal appearance. He is well-developed.  HENT:     Head: Normocephalic and atraumatic.  Eyes:     Conjunctiva/sclera: Conjunctivae normal.     Pupils: Pupils are equal, round, and reactive to light.  Cardiovascular:     Rate and Rhythm: Normal rate and regular rhythm.     Heart sounds: Normal heart sounds.  Pulmonary:     Effort: Pulmonary effort is normal. No respiratory distress.     Breath sounds: Normal breath sounds.  Chest:     Chest wall: Tenderness present.     Comments: Mild tenderness with palpation along the left costochondral margin.  Patient's pain is completely reproduced with palpation of this area. Abdominal:     General: There is no distension.     Palpations: Abdomen is soft.     Tenderness: There is no abdominal tenderness.  Musculoskeletal:        General: No deformity. Normal range of motion.     Cervical back: Normal range of motion and neck supple.  Skin:    General: Skin is warm and dry.  Neurological:     General: No focal deficit present.     Mental Status: He is alert and oriented to person, place, and time.     ED  Results / Procedures / Treatments   Labs (all labs ordered are listed, but only abnormal results are displayed) Labs Reviewed  BASIC METABOLIC PANEL WITH GFR  CBC  TROPONIN I (HIGH SENSITIVITY)    EKG EKG Interpretation Date/Time:  Wednesday December 18 2023 16:19:53 EDT Ventricular Rate:  62 PR Interval:  168 QRS Duration:  115 QT Interval:  401 QTC Calculation: 408 R Axis:   83  Text Interpretation: Sinus rhythm Nonspecific intraventricular conduction delay Borderline ST elevation, anterolateral leads Confirmed by Kristine Royal [phone]) on 12/18/2023 4:23:04 PM  Radiology DG Chest 2 View Result Date: 12/18/2023 CLINICAL DATA:  Left-sided chest pain. EXAM: CHEST - 2 VIEW COMPARISON:  01/02/2017. FINDINGS: Bilateral lung fields are clear. Bilateral costophrenic angles are clear. Normal  cardio-mediastinal silhouette. No acute osseous abnormalities. The soft tissues are within normal limits. IMPRESSION: No active cardiopulmonary disease. Electronically Signed   By: Jules Schick M.D.   On: 12/18/2023 16:11    Procedures Procedures    Medications Ordered in ED Medications  ibuprofen (ADVIL) tablet 600 mg (600 mg Oral Given 12/18/23 1618)    ED Course/ Medical Decision Making/ A&P                                 Medical Decision Making Amount and/or Complexity of Data Reviewed Labs: ordered. Radiology: ordered.  Risk OTC drugs.    Medical Screen Complete  This patient presented to the ED with complaint of chest wall pain.  This complaint involves an extensive number of treatment options. The initial differential diagnosis includes, but is not limited to, costochondritis, pneumothorax, pneumonia, ACS, etc.  This presentation is: Acute, Self-Limited, Previously Undiagnosed, Uncertain Prognosis, Complicated, and Systemic Symptoms  Patient presents with complaint of left-sided anterior chest wall pain.  Describes symptoms and exam are entirely consistent with costochondritis.  Screening labs and EKG are without significant abnormality.  EKG is without evidence of ischemia.  Troponin is without elevation.  Chest x-ray obtained is without significant acute abnormality.  Patient feels improved after ibuprofen.  Importance of close follow-up stressed.  Strict return precautions given and understood.  Co morbidities that complicated the patient's evaluation  See HPI   Additional history obtained:  External records from outside sources obtained and reviewed including prior ED visits and prior Inpatient records.    Problem List / ED Course:  Chest Wall pain   Reevaluation:  After the interventions noted above, I reevaluated the patient and found that they have: resolved   Disposition:  After consideration of the diagnostic results and the  patients response to treatment, I feel that the patent would benefit from close outpatient followup.          Final Clinical Impression(s) / ED Diagnoses Final diagnoses:  Chest wall pain    Rx / DC Orders ED Discharge Orders     None         Wynetta Fines, MD 12/18/23 1635

## 2023-12-18 NOTE — Telephone Encounter (Signed)
  Chief Complaint: chest pain  Symptoms: pain radiates to back/stomach  Frequency: constant  Disposition: [x] ED /[] Urgent Care (no appt availability in office) / [] Appointment(In office/virtual)/ []  [place] [facility]/ [] Home Care/ [] Refused Recommended Disposition /[] [place] Mobile [facility]/ []  Follow-up with PCP Additional Notes: Pt complaining of chest pain on left side since Saturday. Pt states he thought he was getting sick. Pt had nausea, headache, and chills. Those symptoms have resolved, but chest pain is intensifying. Pt rated pain 8/10.  RN advised pt go to ED now. Pt stated he will go after work. RN explained risks of continuing to wait.  RN gave care advice and pt verbalized understanding. CAL Claris Che) notified and was told to send CRM.          Copied from CRM [id number]. Topic: Clinical - Red Word Triage >> Dec 18, 2023  9:18 AM [place] A wrote: [facility] that prompted transfer to Nurse Triage: Patient has pain on left side of lower side, loss of appetite, weak, chills. Pain 1-10;8 sharp pain Reason for Disposition  SEVERE chest pain  Answer Assessment - Initial Assessment Questions 1. LOCATION: "Where does it hurt?"       Left side at heart area  2. RADIATION: "Does the pain go anywhere else?" (e.g., into neck, jaw, arms, back)     Radiates to back and stomach  3. ONSET: "When did the chest pain begin?" (Minutes, hours or days)      4 days ago  4. PATTERN: "Does the pain come and go, or has it been constant since it started?"  "Does it get worse with exertion?"      Constant   6. SEVERITY: "How bad is the pain?"  (e.g., Scale 1-10; mild, moderate, or severe)    - MILD (1-3): doesn't interfere with normal activities     - MODERATE (4-7): interferes with normal activities or awakens from sleep    - SEVERE (8-10): excruciating pain, unable to do any normal activities       8 7. CARDIAC RISK FACTORS: "Do you have any history of heart problems or risk factors for heart  disease?" (e.g., angina, prior heart attack; diabetes, high blood pressure, high cholesterol, smoker, or strong family history of heart disease)     Wore cardiac monitor years ago  8. PULMONARY RISK FACTORS: "Do you have any history of lung disease?"  (e.g., blood clots in lung, asthma, emphysema, birth control pills)     Asthma  9 10. OTHER SYMPTOMS: "Do you have any other symptoms?" (e.g., dizziness, nausea, vomiting, sweating, fever, difficulty breathing, cough)       Monday- nausea, headache, stomachache  Protocols used: Chest Pain-A-AH

## 2023-12-18 NOTE — Discharge Instructions (Addendum)
Return for any problem.   Take Ibuprofen - 600mg every 8 hours - for pain.  

## 2023-12-18 NOTE — Telephone Encounter (Signed)
Agree with ED disposition.

## 2023-12-18 NOTE — ED Triage Notes (Signed)
 Patient presents due to left side chest pain. Pain began Saturday. Pain is deep and sharp and radiated to back and abdomin. He also reports shortness of breath.

## 2024-03-02 ENCOUNTER — Encounter: Admitting: Family Medicine

## 2024-03-04 ENCOUNTER — Encounter: Admitting: Family Medicine

## 2024-03-04 DIAGNOSIS — F324 Major depressive disorder, single episode, in partial remission: Secondary | ICD-10-CM

## 2024-03-04 DIAGNOSIS — F411 Generalized anxiety disorder: Secondary | ICD-10-CM

## 2024-03-04 DIAGNOSIS — E559 Vitamin D deficiency, unspecified: Secondary | ICD-10-CM

## 2024-03-06 ENCOUNTER — Encounter: Admitting: Family Medicine

## 2024-03-11 ENCOUNTER — Encounter: Payer: Self-pay | Admitting: Family Medicine
